# Patient Record
Sex: Male | Born: 1985 | Race: Black or African American | Hispanic: No | Marital: Single | Smoking: Light tobacco smoker
Health system: Southern US, Community
[De-identification: ages and names within clinical notes are randomized; demographics above are authoritative.]

## PROBLEM LIST (undated history)

## (undated) DIAGNOSIS — Z8489 Family history of other specified conditions: Secondary | ICD-10-CM

## (undated) DIAGNOSIS — Z98811 Dental restoration status: Secondary | ICD-10-CM

## (undated) DIAGNOSIS — R221 Localized swelling, mass and lump, neck: Secondary | ICD-10-CM

## (undated) DIAGNOSIS — F32A Depression, unspecified: Secondary | ICD-10-CM

## (undated) DIAGNOSIS — F419 Anxiety disorder, unspecified: Secondary | ICD-10-CM

## (undated) HISTORY — DX: Depression, unspecified: F32.A

## (undated) HISTORY — DX: Anxiety disorder, unspecified: F41.9

## (undated) HISTORY — PX: NO PAST SURGERIES: SHX2092

---
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
## 2010-06-04 ENCOUNTER — Ambulatory Visit
Admission: RE | Admit: 2010-06-04 | Discharge: 2010-06-04 | Disposition: A | Discharge status: Home / Self Care | Payer: BC Managed Care – PPO | Source: Ambulatory Visit | Attending: Family Medicine | Admitting: Family Medicine

## 2010-06-04 ENCOUNTER — Other Ambulatory Visit: Payer: Self-pay | Admitting: Family Medicine

## 2010-06-04 DIAGNOSIS — R0600 Dyspnea, unspecified: Secondary | ICD-10-CM

## 2010-06-04 MED ORDER — IOHEXOL 300 MG/ML  SOLN
125.0000 mL | Freq: Once | INTRAMUSCULAR | Status: AC | PRN
  Administered 2010-06-04: 125 mL via INTRAVENOUS

## 2011-10-04 ENCOUNTER — Ambulatory Visit (INDEPENDENT_AMBULATORY_CARE_PROVIDER_SITE_OTHER): Payer: BC Managed Care – PPO | Admitting: Internal Medicine

## 2011-10-04 ENCOUNTER — Ambulatory Visit: Payer: BC Managed Care – PPO

## 2011-10-04 VITALS — BP 130/90 | HR 68 | Temp 98.1°F | Resp 16 | Ht 76.0 in | Wt 215.0 lb

## 2011-10-04 DIAGNOSIS — R51 Headache: Secondary | ICD-10-CM

## 2011-10-04 DIAGNOSIS — R55 Syncope and collapse: Secondary | ICD-10-CM

## 2011-10-04 LAB — COMPREHENSIVE METABOLIC PANEL
ALT: 14 U/L (ref 0–53)
AST: 18 U/L (ref 0–37)
Albumin: 4.6 g/dL (ref 3.5–5.2)
Alkaline Phosphatase: 54 U/L (ref 39–117)
BUN: 12 mg/dL (ref 6–23)
CO2: 30 mEq/L (ref 19–32)
Calcium: 9.8 mg/dL (ref 8.4–10.5)
Chloride: 99 mEq/L (ref 96–112)
Creat: 1.16 mg/dL (ref 0.50–1.35)
Glucose, Bld: 96 mg/dL (ref 70–99)
Potassium: 4.6 mEq/L (ref 3.5–5.3)
Sodium: 138 mEq/L (ref 135–145)
Total Bilirubin: 0.6 mg/dL (ref 0.3–1.2)
Total Protein: 7.5 g/dL (ref 6.0–8.3)

## 2011-10-04 LAB — POCT CBC
Granulocyte percent: 39.3 %G (ref 37–80)
HCT, POC: 43.8 % (ref 43.5–53.7)
Hemoglobin: 13.8 g/dL — AB (ref 14.1–18.1)
Lymph, poc: 2.5 (ref 0.6–3.4)
MCH, POC: 28.6 pg (ref 27–31.2)
MCHC: 31.5 g/dL — AB (ref 31.8–35.4)
MCV: 90.6 fL (ref 80–97)
MID (cbc): 0.5 (ref 0–0.9)
MPV: 10 fL (ref 0–99.8)
POC Granulocyte: 2 (ref 2–6.9)
POC LYMPH PERCENT: 50.1 %L — AB (ref 10–50)
POC MID %: 10.6 %M (ref 0–12)
Platelet Count, POC: 169 10*3/uL (ref 142–424)
RBC: 4.83 M/uL (ref 4.69–6.13)
RDW, POC: 13.1 %
WBC: 5 10*3/uL (ref 4.6–10.2)

## 2011-10-04 LAB — TSH: TSH: 1.564 u[IU]/mL (ref 0.350–4.500)

## 2011-10-04 LAB — GLUCOSE, POCT (MANUAL RESULT ENTRY): POC Glucose: 102 mg/dl — AB (ref 70–99)

## 2011-10-04 NOTE — Patient Instructions (Addendum)
Syncope Syncope is a fainting spell. This means the person loses consciousness and drops to the ground. The person is generally unconscious for less than 5 minutes. The person may have some muscle twitches for up to 15 seconds before waking up and returning to normal. Syncope occurs more often in elderly people, but it can happen to anyone. While most causes of syncope are not dangerous, syncope can be a sign of a serious medical problem. It is important to seek medical care.  CAUSES  Syncope is caused by a sudden decrease in blood flow to the brain. The specific cause is often not determined. Factors that can trigger syncope include:  Taking medicines that lower blood pressure.  Sudden changes in posture, such as standing up suddenly.  Taking more medicine than prescribed.  Standing in one place for too long.  Seizure disorders.  Dehydration and excessive exposure to heat.  Low blood sugar (hypoglycemia).  Straining to have a bowel movement.  Heart disease, irregular heartbeat, or other circulatory problems.  Fear, emotional distress, seeing blood, or severe pain. SYMPTOMS  Right before fainting, you may:  Feel dizzy or lightheaded.  Feel nauseous.  See all white or all black in your field of vision.  Have cold, clammy skin. DIAGNOSIS  Your caregiver will ask about your symptoms, perform a physical exam, and perform electrocardiography (ECG) to record the electrical activity of your heart. Your caregiver may also perform other heart or blood tests to determine the cause of your syncope. TREATMENT  In most cases, no treatment is needed. Depending on the cause of your syncope, your caregiver may recommend changing or stopping some of your medicines. HOME CARE INSTRUCTIONS  Have someone stay with you until you feel stable.  Do not drive, operate machinery, or play sports until your caregiver says it is okay.  Keep all follow-up appointments as directed by your  caregiver.  Lie down right away if you start feeling like you might faint. Breathe deeply and steadily. Wait until all the symptoms have passed.  Drink enough fluids to keep your urine clear or pale yellow.  If you are taking blood pressure or heart medicine, get up slowly, taking several minutes to sit and then stand. This can reduce dizziness. SEEK IMMEDIATE MEDICAL CARE IF:   You have a severe headache.  You have unusual pain in the chest, abdomen, or back.  You are bleeding from the mouth or rectum, or you have black or tarry stool.  You have an irregular or very fast heartbeat.  You have pain with breathing.  You have repeated fainting or seizure-like jerking during an episode.  You faint when sitting or lying down.  You have confusion.  You have difficulty walking.  You have severe weakness.  You have vision problems. If you fainted, call your local emergency services (911 in [place] Do not drive yourself to the hospital.  MAKE SURE YOU:  Understand these instructions.  Will watch your condition.  Will get help right away if you are not doing well or get worse. Document Released: 12/20/2004 Document Revised: 06/21/2011 Document Reviewed: 02/18/2011 Exit[employer] Patient Information 2013 [employer].  

## 2011-10-04 NOTE — Progress Notes (Signed)
  Subjective:    Patient ID: Robert Schroeder, male    DOB: 03-Jan-1986, 26 y.o.   MRN: [id number]  HPI At phone store, felt dizzy and passed out and fell to floor. EMTs came and evaluated him and cleared him to go home. No hx for seizure activity, no cp or palpitations, no fhx of sudden death or heart disease. Never had these sxs before. Plays basketball regularly with no sxs or problems Review of Systems neg    Objective:   Physical Exam  Constitutional: He is oriented to person, place, and time. He appears well-developed and well-nourished.  HENT:  Right Ear: External ear normal.  Left Ear: External ear normal.  Nose: Nose normal.  Eyes: EOM are normal. Pupils are equal, round, and reactive to light.  Neck: Normal range of motion. Neck supple.  Cardiovascular: Normal rate, regular rhythm and normal heart sounds.   Pulmonary/Chest: Effort normal and breath sounds normal. He exhibits no tenderness.  Abdominal: Soft. Bowel sounds are normal.  Neurological: He is alert and oriented to person, place, and time. He has normal reflexes. No cranial nerve deficit. He exhibits normal muscle tone. Coordination normal.  Skin: Skin is warm and dry.  Psychiatric: He has a normal mood and affect. His behavior is normal. Judgment and thought content normal.  Balance and rhomberg excellent [facility] reading (PRIMARY) by  Dr.Lanyla Costello nl cxr Results for orders placed in visit on 10/04/11  GLUCOSE, POCT (MANUAL RESULT ENTRY)      Component Value Range   POC Glucose 102 (*) 70 - 99 mg/dl  POCT CBC      Component Value Range   WBC 5.0  4.6 - 10.2 K/uL   Lymph, poc 2.5  0.6 - 3.4   POC LYMPH PERCENT 50.1 (*) 10 - 50 %L   MID (cbc) 0.5  0 - 0.9   POC MID % 10.6  0 - 12 %M   POC Granulocyte 2.0  2 - 6.9   Granulocyte percent 39.3  37 - 80 %G   RBC 4.83  4.69 - 6.13 M/uL   Hemoglobin 13.8 (*) 14.1 - 18.1 g/dL   HCT, POC 52.8  41.3 - 53.7 %   MCV 90.6  80 - 97 fL   MCH, POC 28.6  27 - 31.2 pg   MCHC 31.5 (*)  31.8 - 35.4 g/dL   RDW, POC 24.4     Platelet Count, POC 169  142 - 424 K/uL   MPV 10.0  0 - 99.8 fL   .ekg appears nl         Assessment & Plan:  Syncope RF to cardiology If negative w/up consider neuro rf RTC after cardiology eval. Keep diary

## 2011-10-05 ENCOUNTER — Encounter: Payer: Self-pay | Admitting: Cardiology

## 2011-10-05 ENCOUNTER — Encounter: Payer: Self-pay | Admitting: *Deleted

## 2011-10-06 ENCOUNTER — Encounter: Payer: Self-pay | Admitting: Family Medicine

## 2011-10-06 ENCOUNTER — Ambulatory Visit (INDEPENDENT_AMBULATORY_CARE_PROVIDER_SITE_OTHER): Payer: BC Managed Care – PPO | Admitting: Cardiovascular Disease

## 2011-10-06 ENCOUNTER — Encounter: Payer: Self-pay | Admitting: Cardiovascular Disease

## 2011-10-06 ENCOUNTER — Ambulatory Visit (HOSPITAL_COMMUNITY): Payer: BC Managed Care – PPO | Attending: Cardiology

## 2011-10-06 VITALS — BP 133/87 | HR 53 | Ht 75.0 in | Wt 215.0 lb

## 2011-10-06 DIAGNOSIS — I369 Nonrheumatic tricuspid valve disorder, unspecified: Secondary | ICD-10-CM | POA: Insufficient documentation

## 2011-10-06 DIAGNOSIS — R55 Syncope and collapse: Secondary | ICD-10-CM | POA: Insufficient documentation

## 2011-10-06 DIAGNOSIS — I359 Nonrheumatic aortic valve disorder, unspecified: Secondary | ICD-10-CM | POA: Insufficient documentation

## 2011-10-06 NOTE — Progress Notes (Signed)
Patient ID: Robert Schroeder, male   DOB: 1985-11-22, 26 y.o.   MRN: [id number] 26 yo with syncope last week  Had played basketball an hour or two before Standing at [employer] counter and got dizzyy.  No chest pain or palpitaitons.  EMS no no arrhythmia or BP abnormality.  Felt better in 15 minutes.  No seizure activity Last episode years ago in highschool  Normally drinks lots of water and not dehydrated  No drugs.  Active and normally no issues with exercise.  No family history of sudden death.  On no meds.  Denies ETOH.    ROS: Denies fever, malais, weight loss, blurry vision, decreased visual acuity, cough, sputum, SOB, hemoptysis, pleuritic pain, palpitaitons, heartburn, abdominal pain, melena, lower extremity edema, claudication, or rash.  All other systems reviewed and negative   General: Affect appropriate Healthy:  appears stated age HEENT: normal Neck supple with no adenopathy JVP normal no bruits no thyromegaly Lungs clear with no wheezing and good diaphragmatic motion Heart:  S1/S2 no murmur,rub, gallop or click PMI normal Abdomen: benighn, BS positve, no tenderness, no AAA no bruit.  No HSM or HJR Distal pulses intact with no bruits No edema Neuro non-focal Skin warm and dry No muscular weakness  Medications No current outpatient prescriptions on file.    Allergies Review of patient's allergies indicates no known allergies.  Family History: No family history on file.  Social History: History   Social History  . Marital Status: Single    Spouse Name: N/A    Number of Children: N/A  . Years of Education: N/A   Occupational History  . Not on file.   Social History Main Topics  . Smoking status: Never Smoker   . Smokeless tobacco: Not on file  . Alcohol Use: Not on file  . Drug Use: Not on file  . Sexually Active: Not on file   Other Topics Concern  . Not on file   Social History Narrative  . No narrative on file    Electrocardiogram:  NSR normal QT no LVH  no AV block  10/04/11  Assessment and Plan

## 2011-10-06 NOTE — Assessment & Plan Note (Signed)
Not clear of etiology no high risk family history and normal ECG and exam.  Start with ETT and echo  See coronary artery origins if possible on echo.  F/U with me after tests

## 2011-10-06 NOTE — Patient Instructions (Signed)
Your physician recommends that you schedule a follow-up appointment in: AFTER TEST DONE Your physician has requested that you have an echocardiogram. Echocardiography is a painless test that uses sound waves to create images of your heart. It provides your doctor with information about the size and shape of your heart and how well your heart's chambers and valves are working. This procedure takes approximately one hour. There are no restrictions for this procedure. DX SYNCOPE  Your physician has requested that you have an exercise tolerance test. For further information please visit [email or web address] Please also follow instruction sheet, as given. DX SYNCOPE

## 2011-10-06 NOTE — Progress Notes (Signed)
Echocardiogram performed.  

## 2011-10-18 ENCOUNTER — Encounter: Payer: BC Managed Care – PPO | Admitting: Cardiovascular Disease

## 2011-10-25 ENCOUNTER — Encounter: Payer: Self-pay | Admitting: Physician Assistant

## 2011-10-25 ENCOUNTER — Ambulatory Visit (INDEPENDENT_AMBULATORY_CARE_PROVIDER_SITE_OTHER): Payer: BC Managed Care – PPO | Admitting: Physician Assistant

## 2011-10-25 DIAGNOSIS — R55 Syncope and collapse: Secondary | ICD-10-CM

## 2011-10-25 NOTE — Procedures (Signed)
Exercise Treadmill Test  Pre-Exercise Testing Evaluation Rhythm: normal sinus  Rate: 66   PR:  .15 QRS:  .11  QT:  .42 QTc: .44     Test  Exercise Tolerance Test Ordering MD: Charlton Haws, MD  Interpreting MD: Tereso Newcomer , PA-C  Unique Test No: 1  Treadmill:  1  Indication for ETT: Syncope  Contraindication to ETT: No   Stress Modality: exercise - treadmill  Cardiac Imaging Performed: non   Protocol: standard Bruce - maximal  Max BP:  173/69  Max MPHR (bpm):  195 85% MPR (bpm):  166  MPHR obtained (bpm):  162 % MPHR obtained:  83%  Reached 85% MPHR (min:sec):  n/a Total Exercise Time (min-sec):  15:00  Workload in METS:  17.2 Borg Scale: 15  Reason ETT Terminated:  patient's desire to stop    ST Segment Analysis At Rest: normal ST segments - no evidence of significant ST depression With Exercise: no evidence of significant ST depression  Other Information Arrhythmia:  No Angina during ETT:  absent (0) Quality of ETT:  diagnostic  ETT Interpretation:  normal - no evidence of ischemia by ST analysis  Comments: Excellent exercise tolerance. He did report chest pain after the test was complete. Normal BP response to exercise. No ST-T changes to suggest ischemia at 83% age [facility].   Recommendations: Patient did not achieve 85% age [facility]. However, with excellent exercise capacity, this is, overall, a low risk study. Follow up with Dr. Charlton Haws as directed. Luna Glasgow, PA-C  3:39 PM 10/25/2011

## 2011-11-08 ENCOUNTER — Ambulatory Visit: Payer: BC Managed Care – PPO | Admitting: Cardiovascular Disease

## 2011-11-14 DIAGNOSIS — R51 Headache: Secondary | ICD-10-CM | POA: Insufficient documentation

## 2011-11-14 DIAGNOSIS — R519 Headache, unspecified: Secondary | ICD-10-CM | POA: Insufficient documentation

## 2011-11-14 DIAGNOSIS — R079 Chest pain, unspecified: Secondary | ICD-10-CM | POA: Insufficient documentation

## 2011-11-16 ENCOUNTER — Ambulatory Visit (INDEPENDENT_AMBULATORY_CARE_PROVIDER_SITE_OTHER): Payer: BC Managed Care – PPO | Admitting: Cardiovascular Disease

## 2011-11-16 ENCOUNTER — Encounter: Payer: Self-pay | Admitting: Cardiovascular Disease

## 2011-11-16 VITALS — BP 138/66 | HR 64 | Resp 18 | Ht 75.0 in | Wt 218.0 lb

## 2011-11-16 DIAGNOSIS — R079 Chest pain, unspecified: Secondary | ICD-10-CM

## 2011-11-16 DIAGNOSIS — R55 Syncope and collapse: Secondary | ICD-10-CM

## 2011-11-16 DIAGNOSIS — R51 Headache: Secondary | ICD-10-CM

## 2011-11-16 NOTE — Assessment & Plan Note (Signed)
Non recurrent no evidence of cardiac etiology  Normal ECG, exam, ETT and echo

## 2011-11-16 NOTE — Assessment & Plan Note (Signed)
Non recurrent normal ETT observe

## 2011-11-16 NOTE — Patient Instructions (Signed)
Your physician recommends that you schedule a follow-up appointment in: AS NEEDED  Your physician recommends that you continue on your current medications as directed. Please refer to the Current Medication list given to you today.  

## 2011-11-16 NOTE — Assessment & Plan Note (Signed)
Further w/u per Dr Perrin Maltese Does not appear migraneous  PRN NSAi's and or ASA

## 2011-11-16 NOTE — Progress Notes (Signed)
Patient ID: Robert Schroeder, male   DOB: Oct 30, 1985, 26 y.o.   MRN: [id number] 26 yo initially seen for syncope 10/04/11  Reviewed f/u testing  ETT normal with no ischemia and normal HR and BP response Echo :  Normal including coronary artery origins  Since last visity still with some headaches No recurrent syncope  No chest pain palpitations or syncope  ROS: Denies fever, malais, weight loss, blurry vision, decreased visual acuity, cough, sputum, SOB, hemoptysis, pleuritic pain, palpitaitons, heartburn, abdominal pain, melena, lower extremity edema, claudication, or rash.  All other systems reviewed and negative  General: Affect appropriate Healthy:  appears stated age HEENT: normal Neck supple with no adenopathy JVP normal no bruits no thyromegaly Lungs clear with no wheezing and good diaphragmatic motion Heart:  S1/S2 no murmur, no rub, gallop or click PMI normal Abdomen: benighn, BS positve, no tenderness, no AAA no bruit.  No HSM or HJR Distal pulses intact with no bruits No edema Neuro non-focal Skin warm and dry No muscular weakness   No current outpatient prescriptions on file.    Allergies  Review of patient's allergies indicates no known allergies.  Electrocardiogram:  10/04/11  SR rate 56 normal QT 425  Assessment and Plan

## 2011-11-21 ENCOUNTER — Telehealth: Payer: Self-pay | Admitting: Cardiovascular Disease

## 2011-11-21 NOTE — Telephone Encounter (Signed)
Plz return call to pt [phone] regarding work note.

## 2011-11-29 NOTE — Telephone Encounter (Signed)
PER PT  EVERYTHING IS TAKEN CARE OF .Robert Schroeder

## 2011-12-21 ENCOUNTER — Emergency Department (HOSPITAL_COMMUNITY)
Admission: EM | Admit: 2011-12-21 | Discharge: 2011-12-21 | Disposition: A | Discharge status: Home / Self Care | Payer: BC Managed Care – PPO | Attending: Emergency Medicine | Admitting: Emergency Medicine

## 2011-12-21 ENCOUNTER — Emergency Department (HOSPITAL_COMMUNITY): Payer: BC Managed Care – PPO

## 2011-12-21 ENCOUNTER — Encounter (HOSPITAL_COMMUNITY): Payer: Self-pay | Admitting: Emergency Medicine

## 2011-12-21 DIAGNOSIS — R259 Unspecified abnormal involuntary movements: Secondary | ICD-10-CM | POA: Insufficient documentation

## 2011-12-21 DIAGNOSIS — R4782 Fluency disorder in conditions classified elsewhere: Secondary | ICD-10-CM | POA: Insufficient documentation

## 2011-12-21 DIAGNOSIS — R55 Syncope and collapse: Secondary | ICD-10-CM

## 2011-12-21 DIAGNOSIS — Z8679 Personal history of other diseases of the circulatory system: Secondary | ICD-10-CM | POA: Insufficient documentation

## 2011-12-21 DIAGNOSIS — F8081 Childhood onset fluency disorder: Secondary | ICD-10-CM

## 2011-12-21 LAB — CBC WITH DIFFERENTIAL/PLATELET
Basophils Absolute: 0 10*3/uL (ref 0.0–0.1)
Basophils Relative: 0 % (ref 0–1)
Eosinophils Absolute: 0.1 10*3/uL (ref 0.0–0.7)
Eosinophils Relative: 1 % (ref 0–5)
HCT: 43.4 % (ref 39.0–52.0)
Hemoglobin: 14.7 g/dL (ref 13.0–17.0)
Lymphocytes Relative: 43 % (ref 12–46)
Lymphs Abs: 2.7 10*3/uL (ref 0.7–4.0)
MCH: 29.1 pg (ref 26.0–34.0)
MCHC: 33.9 g/dL (ref 30.0–36.0)
MCV: 85.8 fL (ref 78.0–100.0)
Monocytes Absolute: 0.5 10*3/uL (ref 0.1–1.0)
Monocytes Relative: 8 % (ref 3–12)
Neutro Abs: 3 10*3/uL (ref 1.7–7.7)
Neutrophils Relative %: 48 % (ref 43–77)
Platelets: 185 10*3/uL (ref 150–400)
RBC: 5.06 MIL/uL (ref 4.22–5.81)
RDW: 12.2 % (ref 11.5–15.5)
WBC: 6.2 10*3/uL (ref 4.0–10.5)

## 2011-12-21 LAB — BASIC METABOLIC PANEL
BUN: 8 mg/dL (ref 6–23)
CO2: 28 mEq/L (ref 19–32)
Calcium: 9.7 mg/dL (ref 8.4–10.5)
Chloride: 102 mEq/L (ref 96–112)
Creatinine, Ser: 1.08 mg/dL (ref 0.50–1.35)
GFR calc Af Amer: >90 mL/min (ref >90)
GFR calc non Af Amer: >90 mL/min (ref >90)
Glucose, Bld: 93 mg/dL (ref 70–99)
Potassium: 4 mEq/L (ref 3.5–5.1)
Sodium: 139 mEq/L (ref 135–145)

## 2011-12-21 NOTE — ED Notes (Signed)
Patient transported to MRI 

## 2011-12-21 NOTE — Progress Notes (Signed)
Pt confirms pcp is dr Clarene Duke

## 2011-12-21 NOTE — ED Provider Notes (Signed)
History     CSN: [id number]  Arrival date & time 12/21/11  1515   First MD Initiated Contact with Patient 12/21/11 1615      Chief Complaint  Patient presents with  . Dizziness    (Consider location/radiation/quality/duration/timing/severity/associated sxs/prior treatment) HPI Comments: Patient with history of several syncopal and near-syncopal episodes over the past several months, has had negative cardiology workup presents today with episode of lightheadedness without syncope. Today's episode is different because the patient now presents with stuttering speech. As per previous episodes, the patient had a tremor for some time after. No treatments prior to arrival. Patient denies headaches, blurry vision, weakness in his arms or his legs. He denies trouble walking. No nausea or vomiting. No fever or neck pain. Patient denies head injuries. Symptoms were acute. Course constant.  The history is provided by the patient.    Past Medical History  Diagnosis Date  . Syncope   . Chest pain   . Headache     History reviewed. No pertinent past surgical history.  No family history on file.  History  Substance Use Topics  . Smoking status: Never Smoker   . Smokeless tobacco: Not on file  . Alcohol Use: No      Review of Systems  Constitutional: Negative for fever.  HENT: Negative for sore throat and rhinorrhea.   Eyes: Negative for redness.  Respiratory: Negative for cough.   Cardiovascular: Negative for chest pain.  Gastrointestinal: Negative for nausea, vomiting, abdominal pain and diarrhea.  Genitourinary: Negative for dysuria.  Musculoskeletal: Negative for myalgias.  Skin: Negative for rash.  Neurological: Positive for tremors, syncope, speech difficulty and light-headedness. Negative for seizures, facial asymmetry, weakness, numbness and headaches.  Psychiatric/Behavioral: Negative for confusion.    Allergies  Mushroom extract complex  Home Medications   Current  Outpatient Rx  Name  Route  Sig  Dispense  Refill  . PHENOL 1.4 % MT LIQD   Mouth/Throat   Use as directed 1 spray in the mouth or throat as needed. For sore throat           BP 142/109  Pulse 67  Temp 98.7 F (37.1 C) (Oral)  Resp 20  SpO2 99%  Physical Exam  Nursing note and vitals reviewed. Constitutional: He is oriented to person, place, and time. He appears well-developed and well-nourished.  HENT:  Head: Normocephalic and atraumatic.  Right Ear: Tympanic membrane, external ear and ear canal normal.  Left Ear: Tympanic membrane, external ear and ear canal normal.  Nose: Nose normal.  Mouth/Throat: Uvula is midline, oropharynx is clear and moist and mucous membranes are normal.  Eyes: Conjunctivae normal, EOM and lids are normal. Pupils are equal, round, and reactive to light.  Neck: Normal range of motion. Neck supple.  Cardiovascular: Normal rate and regular rhythm.   Pulmonary/Chest: Effort normal and breath sounds normal.  Abdominal: Soft. There is no tenderness.  Musculoskeletal: Normal range of motion.       Cervical back: He exhibits normal range of motion, no tenderness and no bony tenderness.  Neurological: He is alert and oriented to person, place, and time. He has normal strength and normal reflexes. No cranial nerve deficit or sensory deficit. He exhibits normal muscle tone. He displays a negative Romberg sign. Coordination and gait normal. GCS eye subscore is 4. GCS verbal subscore is 5. GCS motor subscore is 6.       Stuttering speech  Skin: Skin is warm and dry.  Psychiatric: He has  a normal mood and affect.    ED Course  Procedures (including critical care time)   Labs Reviewed  CBC WITH DIFFERENTIAL  BASIC METABOLIC PANEL  LAB REPORT - SCANNED   Mr Brain Wo Contrast  12/21/2011  *RADIOLOGY REPORT*  Clinical Data: 26 year old male with dizziness, left side weakness. Recent syncope and headache.  MRI HEAD WITHOUT CONTRAST  Technique:  Multiplanar,  multiecho pulse sequences of the brain and surrounding structures were obtained according to standard protocol without intravenous contrast.  Comparison: None.  Findings: Normal cerebral volume. No restricted diffusion to suggest acute infarction.  No midline shift, mass effect, evidence of mass lesion, ventriculomegaly, extra-axial collection or acute intracranial hemorrhage.  Cervicomedullary junction and pituitary are within normal limits.  Major intracranial vascular flow voids are preserved. Wallace Cullens and white matter signal is within normal limits throughout the brain.  Grossly normal visualized internal auditory structures.  Negative visualized cervical spine.  Normal bone marrow signal. Visualized orbit soft tissues are within normal limits.  Visualized paranasal sinuses and mastoids are clear.  Negative scalp soft tissues.  IMPRESSION: Normal noncontrast MRI appearance of the brain.   Original Report Authenticated By: Erskine Speed, M.D.      1. Near syncope   2. Stuttering     4:17 PM Reviewed cardiologist notes.   Vital signs reviewed and are as follows: Filed Vitals:   12/21/11 1530  BP: 142/109  Pulse: 67  Temp: 98.7 F (37.1 C)  Resp: 20   Pt seen and examined. D/w Dr. Rubin Payor.   5:56 PM Awaiting MRI.   Labs were normal. MRI is normal. Patient and family informed.   On re-exam, patient is entirely back to normal. Speech is clear without stuttering.   Patient is given neurology follow-up. Family and patient states they will follow-up.   Patient counseled to return if they have weakness in their arms or legs, slurred speech, trouble walking or talking, confusion, trouble with their balance, or if they have any other concerns. Patient verbalizes understanding and agrees with plan.   MDM  Patient with near syncope. Recently has had cardiac work-up. MRI ordered and is normal. Speech problem has resolved in ED. Do not feel this represents TIA. Stroke, tumor, bleeding not seen on  MRI. Patient at baseline at discharge.         [place], [place] 12/22/11 [phone]

## 2011-12-21 NOTE — ED Notes (Signed)
Pt presenting to ed with c/o dizziness, cold symptoms. Pt states slurred speech this morning unable able to tell if pt is slurred. Per pt's mother pt is not talking normal. Per pt he was seen recently at [facility] for the same. Pt states he has seen a cardiologist and was told this happens some time. Pt with bilateral equal grips noted. No slurred speech noted in triage.

## 2011-12-22 NOTE — ED Provider Notes (Signed)
Medical screening examination/treatment/procedure(s) were performed by non-physician practitioner and as supervising physician I was immediately available for consultation/collaboration.    Nelia Shi, MD 12/22/11 2251

## 2012-01-05 ENCOUNTER — Ambulatory Visit (INDEPENDENT_AMBULATORY_CARE_PROVIDER_SITE_OTHER): Payer: BC Managed Care – PPO | Admitting: Physician Assistant

## 2012-01-05 VITALS — BP 128/81 | HR 64 | Temp 98.8°F | Resp 16 | Ht 75.0 in | Wt 210.0 lb

## 2012-01-05 DIAGNOSIS — R51 Headache: Secondary | ICD-10-CM

## 2012-01-05 DIAGNOSIS — R55 Syncope and collapse: Secondary | ICD-10-CM

## 2012-01-05 MED ORDER — BUTALBITAL-APAP-CAFFEINE 50-500-40 MG PO TABS: 1.0000 | ORAL_TABLET | ORAL | Status: DC | PRN

## 2012-01-05 NOTE — Progress Notes (Signed)
Subjective:    Patient ID: Robert Schroeder, male    DOB: [birth year], 27 y.o.   MRN: [id number]  HPI   Robert Schroeder is a very pleasant 27 yr old male who is accompanied by his mother today.  He was initially seen here 10/04/11 after a syncopal episode.  He had a negative workup by cardiology over the next several weeks.  He presented to the [facility] ED 12/11/11 for an episode of pre-syncope with associated stuttering of speech.  Workup in the ED was negative, including normal MRI of the brain.  He has an appointment to see neurology next week on 01/12/12.  For the last week or so Robert Schroeder states he has been having "migraines".  After the headaches go away he gets "sharp pains" all over.  The headaches occasionally wake him up in the night, or he wakes up with headaches in the morning.  Occasionally HA is accompanied by blurred vision.  He often has photophobia and phonophobia.  States he has recently become very sensitive to smells.  The pain is generally right sided, but sometimes frontal as well.  Sometimes he has pain at the top of his head.  Some times pain in the right side of his neck.  Headaches last minutes to hours.  Generally he drinks some water and goes to a dark room to rest.  Sometimes takes ibuprofen or BC powder.  No family history of migraine.  No prior history of HA.   Additionally he continues to have pre-syncopal episodes, especially when standing up.  Sometimes these occur with his HAs, sometimes independently.  No further syncope.  Sometimes he feels his heart race with these episodes, sometimes he is sweaty and nauseous.  Has some concern of BP as it was elevated at last hospital visit.  Does have family hx of HTN but BP has returned to normal, today 128/81  States he is sleeping a lot lately due to headaches.  Stays well hydrated, drinks water throughout the day.  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  Denies any stressors at work or home.      Review of Systems  Constitutional: Negative for fever and chills.  HENT: Positive for neck pain (right sided). Negative for ear pain, congestion, sore throat, rhinorrhea and sinus pressure.   Eyes: Negative.   Respiratory: Positive for shortness of breath (occasionally). Negative for cough.   Cardiovascular: Positive for palpitations (occasionally). Negative for chest pain.  Gastrointestinal: Negative.   Skin: Negative.   Neurological: Positive for dizziness, light-headedness and headaches. Negative for syncope.       Objective:   Physical Exam  Vitals reviewed. Constitutional: He is oriented to person, place, and time. He appears well-developed and well-nourished. No distress.  HENT:  Head: Normocephalic and atraumatic.  Eyes: Conjunctivae normal and EOM are normal. Pupils are equal, round, and reactive to light. No scleral icterus.  Fundoscopic exam:      The right eye shows no arteriolar narrowing, no AV nicking and no papilledema.       The left eye shows no arteriolar narrowing, no AV nicking and no papilledema.  Neck: Normal range of motion. Neck supple.  Cardiovascular: Normal rate, regular rhythm, normal heart sounds and intact distal pulses.  Exam reveals no gallop and no friction rub.   No murmur heard. Pulmonary/Chest: Effort normal and breath sounds normal. He has no wheezes. He has no rales.  Neurological: He is alert  and oriented to person, place, and time. He has normal reflexes. No cranial nerve deficit. Coordination normal.  Skin: Skin is warm and dry.  Psychiatric: He has a normal mood and affect. His behavior is normal.      Filed Vitals:   01/05/12 1344  BP: 128/81  Pulse: 64  Temp: 98.8 F (37.1 C)  Resp: 16       Assessment & Plan:   1. Headache  butalbital-acetaminophen-caffeine (ESGIC PLUS) 50-500-40 MG per tablet  2. Pre-syncope       Robert Schroeder is a very pleasant 27 yr old male here with several concerns and to follow-up after an ED  visit.  (1) Headache - Etiology is somewhat unclear.  This could be migraine although the presentation is not classic given differing locations of his pain.  Exam is normal today.  No neuro deficits.  Will try Fioricet for headaches.  Neurology appointment is scheduled for next week, will await their input.    (2) Pre-syncope - Again, etiology is unclear.  Cardiac work up is negative, as is brain MRI from two weeks ago.  Exam is normal today.  Wonder if there may be an anxiety component, though patient denies this.  Again, will await neurology input.    (3) Elevated blood pressure - Discussed with pt and mom that BP is normal today.  Pt states he can check his pressure at work.  Encouraged to check BP approx twice/week for the next couple weeks.  If he consistently sees elevated readings, we can discuss medication.  Discussed RTC precautions with pt and mother.  If he is worsening between now and his neuro appt he will RTC or proceed to the ED.

## 2012-01-05 NOTE — Patient Instructions (Addendum)
Begin using Fioricet when you feel a headache coming on.  Start with 1 pill, but you may take 2 at a time if needed.  You may take this as frequently as every 4 hours if needed.  DO NOT exceed more than 6 doses per day.  DO NOT take additional Tylenol or products containing acetaminophen.  You may take ibuprofen.    Let's see how you neurology appointment goes on 01/12/12 and go from there.  Let us know if anything comes up in the mean time.  If you feel like your episodes are worsening between now and your neurology appointment, come back in or proceed to the ER.

## 2012-01-20 ENCOUNTER — Ambulatory Visit: Payer: BC Managed Care – PPO | Admitting: Emergency Medicine

## 2012-01-20 ENCOUNTER — Ambulatory Visit: Payer: BC Managed Care – PPO

## 2012-01-20 VITALS — BP 126/83 | HR 59 | Temp 98.3°F | Resp 16 | Ht 76.0 in | Wt 211.8 lb

## 2012-01-20 DIAGNOSIS — R079 Chest pain, unspecified: Secondary | ICD-10-CM

## 2012-01-20 DIAGNOSIS — M79642 Pain in left hand: Secondary | ICD-10-CM

## 2012-01-20 DIAGNOSIS — M79609 Pain in unspecified limb: Secondary | ICD-10-CM

## 2012-01-20 NOTE — Patient Instructions (Signed)
Contusion (Bruise) of Hand  An injury to the hand may cause bruises (contusions). Contusions are caused by bleeding from small blood vessels (capillaries) that allow blood to leak out into the muscles, tendons, and surrounding soft tissue. This is followed by swelling and pain (inflammation). Contusions of the hand are common because of the use of hands in daily and recreational activities. Signs of a hand injury include pain, swelling, and a color change. Initially the skin may turn blue to purple in color. As the bruise ages, the color turns yellow and orange. Swelling may decrease the movement of the fingers. Contusions are seen more commonly with:   Contact sports (especially in football, wrestling, and basketball).   Use of medications that thin the blood (anticoagulants).   Use of aspirin and nonsteroidal anti-inflammatory agents that decrease the ability of the blood to clot.   Vitamin deficiencies.   Aging.  DIAGNOSIS   Diagnosis of hand injuries can be made by your own observation. If problems continue, a caregiver may be required for further evaluation and treatment. X-rays may be required to make sure there are no broken bones (fractures). Continued problems may require physical therapy for treatment.  RISKS AND COMPLICATIONS   Extensive bleeding and tissue inflammation. This can lead to disability and arthritis-type problems later on if the hand does not heal properly.   Infection of the hand if there are breaks in the skin. This is especially true if the hand injury came from someone's teeth, such as would occur with punching someone in the mouth. This can lead to an infection of the tendons and the membranes surrounding the tendons (sheaths). This infection can have severe complications including a loss of function (a "frozen" hand).   Rupture of the tendons requiring a surgical repair. Failure to repair the tendons can result in loss of function of the hand or fingers.  HOME CARE INSTRUCTIONS     Apply ice to the injury for 15 to 20 minutes, 3 to 4 times per day. Put the ice in a plastic bag and place a towel between the bag of ice and your skin.   An elastic bandage may be used initially for support and to minimize swelling. Do not wrap the hand too tightly. Do not sleep with the elastic bandage on.   Gentle massage from the fingertips towards the elbow will help keep the swelling down. Gently open and close your fist while doing this to maintain range of motion. Do this only after the first few days, when there is no or minimal pain.   Keep your hand above the level of the heart when swelling and pain are present. This will allow the fluid to drain out of the hand, decreasing the amount of swelling. This will improve healing time.   Try to avoid use of the injured hand (except for gentle range of motion) while the hand is hurting. Do not resume use until instructed by your caregiver. Then begin use gradually, do not increase use to the point of pain. If pain does develop, decrease use and continue the above measures, gradually increasing activities that do not cause discomfort until you achieve normal use.   Only take over-the-counter or prescription medicines for pain, discomfort, or fever as directed by your caregiver.   Follow up with your caregiver as directed. Follow-up care may include orthopedic referrals, physical therapy, and rehabilitation. Any delay in obtaining necessary care could result in delayed healing, or temporary or permanent disability.  REHABILITATION     Begin daily rehabilitation exercises when an elastic bandage is no longer needed and you are either pain free or only have minimal pain.   Use ice massage for 10 minutes before and after workouts. Put ice in a plastic bag and place a towel between the bag of ice and your skin. Massage the injured area with the ice pack.  SEEK IMMEDIATE MEDICAL CARE IF:    Your pain and swelling increase, or pain is uncontrolled with  medications.   You have loss of feeling in your hand, or your hand turns cold or blue.   An oral temperature above 102 F (38.9 C) develops, not controlled by medication.   Your hand becomes warm to the touch, or you have increased pain with even slight movement of your fingers.   Your hand does not begin to improve in 1 or 2 days.   The skin is broken and signs of infection occur (fluid draining from the contusion, increasing pain, fever, headache, muscle aches, dizziness, or a general ill feeling).   You develop new, unexplained problems, or an increase of the symptoms that brought you to your caregiver.  MAKE SURE YOU:    Understand these instructions.   Will watch your condition.   Will get help right away if you are not doing well or get worse.  Document Released: 06/11/2001 Document Revised: 03/14/2011 Document Reviewed: 05/29/2009  [employer] Patient Information 2013 [employer].

## 2012-01-20 NOTE — Progress Notes (Signed)
[facility] [address], [place] [address] [phone]  Date:  01/20/2012   Name:  Robert Schroeder   DOB:  [birth year]   MRN:  [id number]  PCP:  Mickie Hillier, MD    Chief Complaint: Chest Pain and Hand Pain   History of Present Illness:  Robert Schroeder is a 27 y.o. very pleasant male patient who presents with the following:  Chest pain today.  Lasted two hours and was a tightness then decreased to a discomfort. Now has a trace. No nausea or vomiting. Some dizziness and sensation of rapid heart rate.  No diaphoresis.  Has undergone workup for chest pain and syncope in past with negative stress EKG and echo.  Not seen by cardiologist since Thanksgiving.  Was at rest when this happened today.  Pain and tedderness and guarding in left hand following a basketball game where he thinks he struck another player's arm  Patient Active Problem List  Diagnosis  . Syncope  . Chest pain  . Headache    Past Medical History  Diagnosis Date  . Syncope   . Chest pain   . Headache     No past surgical history on file.  History  Substance Use Topics  . Smoking status: Never Smoker   . Smokeless tobacco: Not on file  . Alcohol Use: No    Family History  Problem Relation Age of Onset  . Thyroid disease Mother   . Hypertension Mother   . Cancer Father   . Hyperlipidemia Father   . Hypertension Maternal Grandmother   . Alcohol abuse Maternal Grandfather   . Heart disease Paternal Grandmother   . Heart disease Paternal Grandfather     Allergies  Allergen Reactions  . Mushroom Extract Complex Swelling    Throat swells    Medication list has been reviewed and updated.  Current Outpatient Prescriptions on File Prior to Visit  Medication Sig Dispense Refill  . butalbital-acetaminophen-caffeine (ESGIC PLUS) 50-500-40 MG per tablet Take 1 tablet by mouth every 4 (four) hours as needed for pain.  30 tablet  0  . phenol (CHLORASEPTIC) 1.4 % LIQD Use as  directed 1 spray in the mouth or throat as needed. For sore throat        Review of Systems:  As per HPI, otherwise negative.    Physical Examination: Filed Vitals:   01/20/12 1545  BP: 126/83  Pulse: 59  Temp: 98.3 F (36.8 C)  Resp: 16   Filed Vitals:   01/20/12 1545  Height: 6\' 4"  (1.93 m)  Weight: 211 lb 12.8 oz (96.072 kg)   Body mass index is 25.78 kg/(m^2). Ideal Body Weight: Weight in (lb) to have BMI = 25: 205   GEN: WDWN, NAD, Non-toxic, A & O x 3 HEENT: Atraumatic, Normocephalic. Neck supple. No masses, No LAD. Ears and Nose: No external deformity. CV: RRR, No M/G/R. No JVD. No thrill. No extra heart sounds. PULM: CTA B, no wheezes, crackles, rhonchi. No retractions. No resp. distress. No accessory muscle use. ABD: S, NT, ND, +BS. No rebound. No HSM. EXTR: No c/c/e NEURO Normal gait.  PSYCH: Normally interactive. Conversant. Not depressed or anxious appearing.  Calm demeanor.  LEFT hand: tender over fifth MC  Assessment and Plan: Chest pain Follow up with cardiologist  Hand contusion  Carmelina Dane, MD  [facility] reading (PRIMARY) by  Dr. Dareen Piano.  negative.

## 2012-07-16 ENCOUNTER — Ambulatory Visit: Payer: BC Managed Care – PPO

## 2013-02-03 DIAGNOSIS — R221 Localized swelling, mass and lump, neck: Secondary | ICD-10-CM

## 2013-02-03 HISTORY — DX: Localized swelling, mass and lump, neck: R22.1

## 2013-02-06 ENCOUNTER — Ambulatory Visit (INDEPENDENT_AMBULATORY_CARE_PROVIDER_SITE_OTHER): Payer: BC Managed Care – PPO | Admitting: Emergency Medicine

## 2013-02-06 VITALS — BP 127/79 | HR 60 | Temp 98.6°F | Resp 18 | Wt 232.0 lb

## 2013-02-06 DIAGNOSIS — L723 Sebaceous cyst: Secondary | ICD-10-CM

## 2013-02-06 MED ORDER — DOXYCYCLINE HYCLATE 100 MG PO CAPS: 100.0000 mg | ORAL_CAPSULE | Freq: Two times a day (BID) | ORAL | Status: DC

## 2013-02-06 NOTE — Progress Notes (Addendum)
   Subjective:  This chart was scribed for Jeannett SeniorStephen A. Samuel Jesteraube, MD by Ronal Fearuke Okeke, ED Scribe. This patient was seen in room 9 and the patient's care was started at 12:50 PM.   Patient ID: Robert Schroeder, male    DOB: 10/08/1985, 28 y.o.   MRN: [id number]  HPI HPI Comments: Robert Schroeder is a 28 y.o. male who presents to the Emergency Department complaining of a lump on the back of his neck that appeared 3 days ago with associated pain. The area has been slowly increasing in size. He has no history of a sister problems in that area. He is concerned because his father is under treatment for a leiomyosarcoma .  Patient Active Problem List   Diagnosis Date Noted  . Chest pain   . Headache   . Syncope 10/06/2011   Past Medical History  Diagnosis Date  . Syncope   . Chest pain   . Headache(784.0)    No past surgical history on file. Allergies  Allergen Reactions  . Mushroom Extract Complex Swelling    Throat swells   Prior to Admission medications   Not on File   History   Social History  . Marital Status: Single    Spouse Name: N/A    Number of Children: N/A  . Years of Education: N/A   Occupational History  . Not on file.   Social History Main Topics  . Smoking status: Never Smoker   . Smokeless tobacco: Not on file  . Alcohol Use: No  . Drug Use: No  . Sexual Activity: Not on file   Other Topics Concern  . Not on file   Social History Narrative  . No narrative on file      Review of Systems  Skin:       abscess       Objective:   Physical Exam there is a 1 x 2 cm raised mobile area on the right posterior cervical area. There is a small droplet of purulent material which can be expressed. The area was cleaned thoroughly with Betadine and numbed with 2 cc of 2% plain incision made with a #11 blade a small amount of sebaceous material with purulence was removed packing was placed. patient tolerated the procedure well.  Filed Vitals:   02/06/13 1214  BP: 127/79    Pulse: 60  Temp: 98.6 F (37 C)  TempSrc: Oral  Resp: 18  Weight: 232 lb (105.235 kg)  SpO2: 99%         Assessment & Plan:   We'll treat with doxy 100 twice a day. packing was placed. Change packing in the morning .   I personally performed the services described in this documentation, which was scribed in my presence. The recorded information has been reviewed and is accurate.

## 2013-02-07 ENCOUNTER — Ambulatory Visit (INDEPENDENT_AMBULATORY_CARE_PROVIDER_SITE_OTHER): Payer: BC Managed Care – PPO | Admitting: Emergency Medicine

## 2013-02-07 VITALS — BP 118/80 | HR 76 | Temp 98.1°F | Resp 16 | Ht 76.0 in | Wt 231.8 lb

## 2013-02-07 DIAGNOSIS — M542 Cervicalgia: Secondary | ICD-10-CM

## 2013-02-07 DIAGNOSIS — L720 Epidermal cyst: Secondary | ICD-10-CM

## 2013-02-07 DIAGNOSIS — L723 Sebaceous cyst: Secondary | ICD-10-CM

## 2013-02-07 MED ORDER — IBUPROFEN 800 MG PO TABS: 800.0000 mg | ORAL_TABLET | Freq: Three times a day (TID) | ORAL | Status: DC | PRN

## 2013-02-07 NOTE — Progress Notes (Signed)
   Subjective:    Patient ID: Robert Schroeder, male    DOB: 1985/03/09, 28 y.o.   MRN: [id number]  HPI Patient here for followup of infected sebaceous cyst on the posterior portion of his neck yesterday. Culture was done a small amount of sebaceous debris was removed with hemostat. He returns today for repacking   Review of Systems     Objective:   Physical Exam there is no redness extending around the I&D site. Packing is still in place. We'll remove the packing try and remove more sebaceous debris and purulent material. Culture was still pending        Assessment & Plan:  Recheck in 24 hours. He'll continue doxycycline. I placed him on ibuprofen 800  3  times a day for pain. He did have a vagal reaction in the office which responded well to keeping his legs elevated and he recovered in approximately 5 minutes.

## 2013-02-07 NOTE — Progress Notes (Signed)
VCO. Local anesthesia with 2% lidocaine plain.  Existing incision extended and re-opened.  Wound was probed revealing small amount of sebaceous material.  Repacked with 1/4 plain packing.  Cleaned and dressed. Patient tolerated well.

## 2013-02-08 ENCOUNTER — Ambulatory Visit (INDEPENDENT_AMBULATORY_CARE_PROVIDER_SITE_OTHER): Payer: BC Managed Care – PPO | Admitting: Physician Assistant

## 2013-02-08 VITALS — BP 110/72 | HR 65 | Temp 98.2°F | Resp 18 | Ht 76.0 in | Wt 235.0 lb

## 2013-02-08 DIAGNOSIS — L723 Sebaceous cyst: Secondary | ICD-10-CM

## 2013-02-08 NOTE — Progress Notes (Signed)
   Subjective:    Patient ID: Robert Schroeder, male    DOB: 07/16/1985, 28 y.o.   MRN: [id number]  HPI   [name] is a 28 yr old male here for wound care following I&D of an infected sebaceous.  He reports he is doing well.  Less pain.  Taking the antibiotics as directed and tolerating them well.  He is not changing the dressing. Denies fever, chills, nausea, vomiting   Review of Systems  Constitutional: Negative for fever and chills.  Respiratory: Negative.   Cardiovascular: Negative.   Gastrointestinal: Negative.   Skin: Positive for wound.       Objective:   Physical Exam  Vitals reviewed. Constitutional: He is oriented to person, place, and time. He appears well-developed and well-nourished. No distress.  HENT:  Head: Normocephalic and atraumatic.  Eyes: Conjunctivae are normal. No scleral icterus.  Pulmonary/Chest: Effort normal.  Neurological: He is alert and oriented to person, place, and time.  Skin: Skin is warm and dry.     Healing wound at right posterior neck; continues to be indurated, but no further fluctuance; no erythema, warmth; unable to express any dranage       Assessment & Plan:  Sebaceous cyst   [name] is a 28 yr old male here for wound care following I&D of infected sebaceous cyst.  The wound appears to be healing well.  I have not repacked today.  Continue daily dressing changes until completely healed.  Pt was referred to surgery for further evaluation, encouraged him to keep this appt.  Follow up here if concerns in the mean time.   Loleta DickerE. Elizabeth Luismiguel Lamere MHS, PA-C [facility] 2/6/20152:48 PM

## 2013-02-09 LAB — WOUND CULTURE
Gram Stain: NONE SEEN
Gram Stain: NONE SEEN
Organism ID, Bacteria: NO GROWTH

## 2013-02-18 ENCOUNTER — Ambulatory Visit (INDEPENDENT_AMBULATORY_CARE_PROVIDER_SITE_OTHER): Payer: BC Managed Care – PPO | Admitting: General Surgery

## 2013-02-18 ENCOUNTER — Encounter (INDEPENDENT_AMBULATORY_CARE_PROVIDER_SITE_OTHER): Payer: Self-pay | Admitting: General Surgery

## 2013-02-18 VITALS — BP 130/80 | HR 72 | Temp 98.6°F | Resp 14 | Ht 76.0 in | Wt 228.8 lb

## 2013-02-18 DIAGNOSIS — L723 Sebaceous cyst: Secondary | ICD-10-CM | POA: Insufficient documentation

## 2013-02-18 NOTE — Progress Notes (Signed)
Subjective:     Patient ID: Robert Schroeder, male   DOB: Feb 05, 1985, 28 y.o.   MRN: [id number]  HPI The patient comes in with a healed old right posterior sebaceous cyst which has been drained and treated with oral doxycycline. He is currently asymptomatic. Otherwise healthy.  Review of Systems No fevers or chills or nausea vomiting.    Objective:   Physical Exam On examination: On the right posterior aspect of his mid neck the patient has a firm lump with a small healed incision in the center. The size is approximately 2 cm. There is no erythema and no drainage.    Assessment:     Sebaceous cyst with possible recurrence.     Plan:     The patient would like to have this removed as soon as possible. We will do so as an outpatient. He will get preoperative antibiotics.

## 2013-02-19 ENCOUNTER — Encounter (HOSPITAL_BASED_OUTPATIENT_CLINIC_OR_DEPARTMENT_OTHER): Payer: Self-pay | Admitting: *Deleted

## 2013-02-21 ENCOUNTER — Encounter (HOSPITAL_BASED_OUTPATIENT_CLINIC_OR_DEPARTMENT_OTHER): Payer: BC Managed Care – PPO | Admitting: Anesthesiology

## 2013-02-21 ENCOUNTER — Encounter (HOSPITAL_BASED_OUTPATIENT_CLINIC_OR_DEPARTMENT_OTHER): Payer: Self-pay | Admitting: *Deleted

## 2013-02-21 ENCOUNTER — Encounter (HOSPITAL_BASED_OUTPATIENT_CLINIC_OR_DEPARTMENT_OTHER)
Admission: RE | Disposition: A | Discharge status: Home / Self Care | Payer: Self-pay | Source: Ambulatory Visit | Attending: General Surgery

## 2013-02-21 ENCOUNTER — Ambulatory Visit (HOSPITAL_BASED_OUTPATIENT_CLINIC_OR_DEPARTMENT_OTHER)
Admission: RE | Admit: 2013-02-21 | Discharge: 2013-02-21 | Disposition: A | Discharge status: Home / Self Care | Payer: BC Managed Care – PPO | Source: Ambulatory Visit | Attending: General Surgery | Admitting: General Surgery

## 2013-02-21 ENCOUNTER — Ambulatory Visit (HOSPITAL_BASED_OUTPATIENT_CLINIC_OR_DEPARTMENT_OTHER): Payer: BC Managed Care – PPO | Admitting: Anesthesiology

## 2013-02-21 DIAGNOSIS — L723 Sebaceous cyst: Secondary | ICD-10-CM | POA: Insufficient documentation

## 2013-02-21 HISTORY — DX: Localized swelling, mass and lump, neck: R22.1

## 2013-02-21 HISTORY — DX: Dental restoration status: Z98.811

## 2013-02-21 HISTORY — PX: CYST REMOVAL NECK: SHX6281

## 2013-02-21 HISTORY — DX: Family history of other specified conditions: Z84.89

## 2013-02-21 SURGERY — EXCISION, CYST, NECK
Anesthesia: Monitor Anesthesia Care | Site: Neck | Laterality: Right

## 2013-02-21 MED ORDER — FENTANYL CITRATE 0.05 MG/ML IJ SOLN: 25.0000 ug | INTRAMUSCULAR | Status: DC | PRN

## 2013-02-21 MED ORDER — FENTANYL CITRATE 0.05 MG/ML IJ SOLN
INTRAMUSCULAR | Status: DC | PRN
  Administered 2013-02-21: 100 ug via INTRAVENOUS

## 2013-02-21 MED ORDER — ONDANSETRON HCL 4 MG/2ML IJ SOLN
INTRAMUSCULAR | Status: DC | PRN
  Administered 2013-02-21: 4 mg via INTRAVENOUS

## 2013-02-21 MED ORDER — LIDOCAINE HCL (CARDIAC) 20 MG/ML IV SOLN
INTRAVENOUS | Status: DC | PRN
  Administered 2013-02-21: 80 mg via INTRAVENOUS

## 2013-02-21 MED ORDER — BUPIVACAINE-EPINEPHRINE 0.25% -1:200000 IJ SOLN
INTRAMUSCULAR | Status: DC | PRN
  Administered 2013-02-21: 3.5 mL

## 2013-02-21 MED ORDER — SUCCINYLCHOLINE CHLORIDE 20 MG/ML IJ SOLN
INTRAMUSCULAR | Status: DC | PRN
  Administered 2013-02-21: 100 mg via INTRAVENOUS

## 2013-02-21 MED ORDER — BUPIVACAINE-EPINEPHRINE PF 0.25-1:200000 % IJ SOLN
INTRAMUSCULAR | Status: AC
  Filled 2013-02-21: qty 30

## 2013-02-21 MED ORDER — DEXAMETHASONE SODIUM PHOSPHATE 4 MG/ML IJ SOLN
INTRAMUSCULAR | Status: DC | PRN
  Administered 2013-02-21: 10 mg via INTRAVENOUS

## 2013-02-21 MED ORDER — MIDAZOLAM HCL 5 MG/5ML IJ SOLN
INTRAMUSCULAR | Status: DC | PRN
  Administered 2013-02-21: 2 mg via INTRAVENOUS

## 2013-02-21 MED ORDER — FENTANYL CITRATE 0.05 MG/ML IJ SOLN: 50.0000 ug | INTRAMUSCULAR | Status: DC | PRN

## 2013-02-21 MED ORDER — CEFAZOLIN SODIUM-DEXTROSE 2-3 GM-% IV SOLR
INTRAVENOUS | Status: AC
  Filled 2013-02-21: qty 50

## 2013-02-21 MED ORDER — MIDAZOLAM HCL 2 MG/ML PO SYRP: 12.0000 mg | ORAL_SOLUTION | Freq: Once | ORAL | Status: DC | PRN

## 2013-02-21 MED ORDER — PROPOFOL 10 MG/ML IV BOLUS
INTRAVENOUS | Status: DC | PRN
  Administered 2013-02-21: 250 mg via INTRAVENOUS

## 2013-02-21 MED ORDER — MIDAZOLAM HCL 2 MG/2ML IJ SOLN
INTRAMUSCULAR | Status: AC
  Filled 2013-02-21: qty 2

## 2013-02-21 MED ORDER — LACTATED RINGERS IV SOLN
INTRAVENOUS | Status: DC
  Administered 2013-02-21 (×2): via INTRAVENOUS

## 2013-02-21 MED ORDER — ONDANSETRON HCL 4 MG/2ML IJ SOLN: 4.0000 mg | Freq: Four times a day (QID) | INTRAMUSCULAR | Status: DC | PRN

## 2013-02-21 MED ORDER — MIDAZOLAM HCL 2 MG/2ML IJ SOLN: 1.0000 mg | INTRAMUSCULAR | Status: DC | PRN

## 2013-02-21 MED ORDER — FENTANYL CITRATE 0.05 MG/ML IJ SOLN
INTRAMUSCULAR | Status: AC
  Filled 2013-02-21: qty 2

## 2013-02-21 MED ORDER — CEFAZOLIN SODIUM-DEXTROSE 2-3 GM-% IV SOLR
2.0000 g | INTRAVENOUS | Status: AC
  Administered 2013-02-21: 2 g via INTRAVENOUS

## 2013-02-21 MED ORDER — LIDOCAINE HCL 4 % MT SOLN
OROMUCOSAL | Status: DC | PRN
  Administered 2013-02-21: 4 mL via TOPICAL

## 2013-02-21 MED ORDER — OXYCODONE HCL 5 MG PO TABS: 5.0000 mg | ORAL_TABLET | Freq: Once | ORAL | Status: DC | PRN

## 2013-02-21 MED ORDER — TRAMADOL HCL 50 MG PO TABS: 50.0000 mg | ORAL_TABLET | Freq: Four times a day (QID) | ORAL | Status: DC | PRN

## 2013-02-21 MED ORDER — OXYCODONE HCL 5 MG/5ML PO SOLN: 5.0000 mg | Freq: Once | ORAL | Status: DC | PRN

## 2013-02-21 SURGICAL SUPPLY — 60 items
ADH SKN CLS APL DERMABOND .7 (GAUZE/BANDAGES/DRESSINGS) ×1
APL SKNCLS STERI-STRIP NONHPOA (GAUZE/BANDAGES/DRESSINGS)
BENZOIN TINCTURE PRP APPL 2/3 (GAUZE/BANDAGES/DRESSINGS) IMPLANT
BLADE SURG 15 STRL LF DISP TIS (BLADE) ×1 IMPLANT
BLADE SURG 15 STRL SS (BLADE) ×3
BLADE SURG ROTATE 9660 (MISCELLANEOUS) IMPLANT
BNDG ADH 5X4 AIR PERM ELC (GAUZE/BANDAGES/DRESSINGS)
BNDG COHESIVE 4X5 WHT NS (GAUZE/BANDAGES/DRESSINGS) IMPLANT
BNDG GAUZE ELAST 4 BULKY (GAUZE/BANDAGES/DRESSINGS) IMPLANT
CANISTER SUCT 1200ML W/VALVE (MISCELLANEOUS) IMPLANT
CHLORAPREP W/TINT 26ML (MISCELLANEOUS) ×3 IMPLANT
CLEANER CAUTERY TIP 5X5 PAD (MISCELLANEOUS) ×1 IMPLANT
CLOSURE WOUND 1/2 X4 (GAUZE/BANDAGES/DRESSINGS)
CLOSURE WOUND 1/4X4 (GAUZE/BANDAGES/DRESSINGS) ×1
COVER MAYO STAND STRL (DRAPES) ×3 IMPLANT
COVER TABLE BACK 60X90 (DRAPES) ×3 IMPLANT
DECANTER SPIKE VIAL GLASS SM (MISCELLANEOUS) IMPLANT
DERMABOND ADVANCED (GAUZE/BANDAGES/DRESSINGS) ×2
DERMABOND ADVANCED .7 DNX12 (GAUZE/BANDAGES/DRESSINGS) IMPLANT
DRAPE LAPAROTOMY T 102X78X121 (DRAPES) ×1 IMPLANT
DRAPE PED LAPAROTOMY (DRAPES) ×2 IMPLANT
DRAPE UTILITY XL STRL (DRAPES) ×4 IMPLANT
DRSG TEGADERM 2-3/8X2-3/4 SM (GAUZE/BANDAGES/DRESSINGS) ×2 IMPLANT
DRSG TEGADERM 4X4.75 (GAUZE/BANDAGES/DRESSINGS) ×3 IMPLANT
ELECT REM PT RETURN 9FT ADLT (ELECTROSURGICAL) ×3
ELECTRODE REM PT RTRN 9FT ADLT (ELECTROSURGICAL) ×1 IMPLANT
GLOVE BIOGEL M 7.0 STRL (GLOVE) ×2 IMPLANT
GLOVE BIOGEL PI IND STRL 7.5 (GLOVE) IMPLANT
GLOVE BIOGEL PI IND STRL 8 (GLOVE) ×1 IMPLANT
GLOVE BIOGEL PI INDICATOR 7.5 (GLOVE) ×2
GLOVE BIOGEL PI INDICATOR 8 (GLOVE) ×2
GLOVE ECLIPSE 7.5 STRL STRAW (GLOVE) ×3 IMPLANT
GOWN STRL REUS W/ TWL LRG LVL3 (GOWN DISPOSABLE) ×1 IMPLANT
GOWN STRL REUS W/TWL LRG LVL3 (GOWN DISPOSABLE) ×6
NDL HYPO 25X1 1.5 SAFETY (NEEDLE) ×1 IMPLANT
NEEDLE HYPO 25X1 1.5 SAFETY (NEEDLE) ×3 IMPLANT
NS IRRIG 1000ML POUR BTL (IV SOLUTION) ×3 IMPLANT
PACK BASIN DAY SURGERY FS (CUSTOM PROCEDURE TRAY) ×3 IMPLANT
PAD CLEANER CAUTERY TIP 5X5 (MISCELLANEOUS) ×2
PENCIL BUTTON HOLSTER BLD 10FT (ELECTRODE) ×3 IMPLANT
SHEET MEDIUM DRAPE 40X70 STRL (DRAPES) IMPLANT
STRIP CLOSURE SKIN 1/2X4 (GAUZE/BANDAGES/DRESSINGS) IMPLANT
STRIP CLOSURE SKIN 1/4X4 (GAUZE/BANDAGES/DRESSINGS) ×1 IMPLANT
SUCTION FRAZIER TIP 10 FR DISP (SUCTIONS) IMPLANT
SUT ETHILON 4 0 PS 2 18 (SUTURE) ×1 IMPLANT
SUT MON AB 4-0 PC3 18 (SUTURE) ×3 IMPLANT
SUT PROLENE 4 0 PS 2 18 (SUTURE) IMPLANT
SUT VIC AB 2-0 SH 27 (SUTURE) ×3
SUT VIC AB 2-0 SH 27XBRD (SUTURE) ×2 IMPLANT
SUT VIC AB 3-0 SH 27 (SUTURE) ×3
SUT VIC AB 3-0 SH 27X BRD (SUTURE) IMPLANT
SUT VIC AB 4-0 SH 27 (SUTURE)
SUT VIC AB 4-0 SH 27XANBCTRL (SUTURE) IMPLANT
SYR BULB 3OZ (MISCELLANEOUS) ×2 IMPLANT
SYR CONTROL 10ML LL (SYRINGE) ×3 IMPLANT
TOWEL OR 17X24 6PK STRL BLUE (TOWEL DISPOSABLE) ×6 IMPLANT
TOWEL OR NON WOVEN STRL DISP B (DISPOSABLE) ×3 IMPLANT
TUBE CONNECTING 20'X1/4 (TUBING)
TUBE CONNECTING 20X1/4 (TUBING) IMPLANT
YANKAUER SUCT BULB TIP NO VENT (SUCTIONS) IMPLANT

## 2013-02-21 NOTE — Op Note (Signed)
OPERATIVE REPORT  DATE OF OPERATION: 02/21/2013  PATIENT:  Robert Schroeder  28 y.o. male  PRE-OPERATIVE DIAGNOSIS:  neck mass, 3.0 cm  POST-OPERATIVE DIAGNOSIS:  neck mass, 3.0 cm  PROCEDURE:  Procedure(s): CYST REMOVAL NECK  SURGEON:  Surgeon(s): Cherylynn RidgesJames O Norva Bowe, MD  ASSISTANT: None  ANESTHESIA:   general  EBL: <20 ml  BLOOD ADMINISTERED: none  DRAINS: none   SPECIMEN:  Source of Specimen:  Excised cystinc area  COUNTS CORRECT:  YES  PROCEDURE DETAILS: The patient was taken to the operating room and placed on the table in the supine position. After an adequate general endotracheal anesthetic was administered he was flipped and placed in the prone position protecting his that way and exposing his right posterior neck.  After a proper time out was performed identifying the patient and the procedure to be performed, the area of excision was marked with a marking pen. A total length of the incision turned out to be 4 cm. We made a oval incision around the previously incised and drained site using a #15 blade. We dissected down to subcutaneous tissue using #15 blade and also electrocautery. This is on the right posterior aspect of the neck. We taken down just superficial to the deep fascia. Electrocautery was used to obtain hemostasis. We reapproximated the subcutaneous tissue using interrupted 3-0 Vicryl suture. The skin was closed using running subcuticular stitch of 4-0 Monocryl all needle counts sponge counts and his ring counts were correct. Dermabond Steri-Strips and Tegaderm used to complete the dressing.  PATIENT DISPOSITION:  PACU - hemodynamically stable.   Cherylynn RidgesWYATT, [name] 2/19/20151:20 PM

## 2013-02-21 NOTE — H&P (View-Only) (Signed)
Subjective:     Patient ID: Robert Schroeder, male   DOB: 06/25/1985, 27 y.o.   MRN: [id number]  HPI The patient comes in with a healed old right posterior sebaceous cyst which has been drained and treated with oral doxycycline. He is currently asymptomatic. Otherwise healthy.  Review of Systems No fevers or chills or nausea vomiting.    Objective:   Physical Exam On examination: On the right posterior aspect of his mid neck the patient has a firm lump with a small healed incision in the center. The size is approximately 2 cm. There is no erythema and no drainage.    Assessment:     Sebaceous cyst with possible recurrence.     Plan:     The patient would like to have this removed as soon as possible. We will do so as an outpatient. He will get preoperative antibiotics.       

## 2013-02-21 NOTE — Anesthesia Preprocedure Evaluation (Signed)
Anesthesia Evaluation  Patient identified by MRN, date of birth, ID band Patient awake    Reviewed: Allergy & Precautions, H&P , NPO status , Patient's Chart, lab work & pertinent test results  Airway Mallampati: II  Neck ROM: full    Dental   Pulmonary neg pulmonary ROS,          Cardiovascular negative cardio ROS      Neuro/Psych  Headaches,    GI/Hepatic   Endo/Other    Renal/GU      Musculoskeletal   Abdominal   Peds  Hematology   Anesthesia Other Findings   Reproductive/Obstetrics                           Anesthesia Physical Anesthesia Plan  ASA: I  Anesthesia Plan: MAC   Post-op Pain Management:    Induction: Intravenous  Airway Management Planned: Simple Face Mask  Additional Equipment:   Intra-op Plan:   Post-operative Plan:   Informed Consent: I have reviewed the patients History and Physical, chart, labs and discussed the procedure including the risks, benefits and alternatives for the proposed anesthesia with the patient or authorized representative who has indicated his/her understanding and acceptance.     Plan Discussed with: CRNA, Anesthesiologist and Surgeon  Anesthesia Plan Comments:         Anesthesia Quick Evaluation

## 2013-02-21 NOTE — Anesthesia Procedure Notes (Signed)
Procedure Name: Intubation Date/Time: 02/21/2013 12:23 PM Performed by: Burna CashONRAD, [place] Churchill C Pre-anesthesia Checklist: Patient identified, Emergency Drugs available, Suction available and Patient being monitored Patient Re-evaluated:Patient Re-evaluated prior to inductionOxygen Delivery Method: Circle System Utilized Preoxygenation: Pre-oxygenation with 100% oxygen Intubation Type: IV induction Ventilation: Mask ventilation without difficulty Laryngoscope Size: Mac and 4 Grade View: Grade I Tube type: Oral Number of attempts: 1 Airway Equipment and Method: stylet and oral airway Placement Confirmation: ETT inserted through vocal cords under direct vision,  positive ETCO2 and breath sounds checked- equal and bilateral Secured at: 24 cm Tube secured with: Tape Dental Injury: Teeth and Oropharynx as per pre-operative assessment

## 2013-02-21 NOTE — Transfer of Care (Signed)
Immediate Anesthesia Transfer of Care Note  Patient: Robert Schroeder  Procedure(s) Performed: Procedure(s) with comments: CYST REMOVAL POSTERIOR NECK (Right) - Posterior neck.  Patient Location: PACU  Anesthesia Type:General  Level of Consciousness: sedated  Airway & Oxygen Therapy: Patient Spontanous Breathing and Patient connected to face mask oxygen  Post-op Assessment: Report given to PACU RN and Post -op Vital signs reviewed and stable  Post vital signs: Reviewed and stable  Complications: No apparent anesthesia complications

## 2013-02-21 NOTE — Anesthesia Postprocedure Evaluation (Signed)
Anesthesia Post Note  Patient: Robert Schroeder  Procedure(s) Performed: Procedure(s) (LRB): CYST REMOVAL POSTERIOR NECK (Right)  Anesthesia type: General  Patient location: PACU  Post pain: Pain level controlled and Adequate analgesia  Post assessment: Post-op Vital signs reviewed, Patient's Cardiovascular Status Stable, Respiratory Function Stable, Patent Airway and Pain level controlled  Last Vitals:  Filed Vitals:   02/21/13 1430  BP: 133/90  Pulse: 61  Temp: 36.4 C  Resp: 16    Post vital signs: Reviewed and stable  Level of consciousness: awake, alert  and oriented  Complications: No apparent anesthesia complications

## 2013-02-21 NOTE — Interval H&P Note (Signed)
History and Physical Interval Note:  02/21/2013 1:25 PM  Robert Schroeder  has presented today for surgery, with the diagnosis of neck mass  The various methods of treatment have been discussed with the patient and family. After consideration of risks, benefits and other options for treatment, the patient has consented to  Procedure(s) with comments: CYST REMOVAL POSTERIOR NECK (Right) - Posterior neck. as a surgical intervention .  The patient's history has been reviewed, patient examined, no change in status, stable for surgery.  I have reviewed the patient's chart and labs.  Questions were answered to the patient's satisfaction.     Aunica Dauphinee, Marta LamasJAMES O

## 2013-02-21 NOTE — Discharge Instructions (Addendum)
Epidermal Cyst An epidermal cyst is sometimes called a sebaceous cyst, epidermal inclusion cyst, or infundibular cyst. These cysts usually contain a substance that looks "pasty" or "cheesy" and may have a bad smell. This substance is a protein called keratin. Epidermal cysts are usually found on the face, neck, or trunk. They may also occur in the vaginal area or other parts of the genitalia of both men and women. Epidermal cysts are usually small, painless, slow-growing bumps or lumps that move freely under the skin. It is important not to try to pop them. This may cause an infection and lead to tenderness and swelling. CAUSES  Epidermal cysts may be caused by a deep penetrating injury to the skin or a plugged hair follicle, often associated with acne. SYMPTOMS  Epidermal cysts can become inflamed and cause:  Redness.  Tenderness.  Increased temperature of the skin over the bumps or lumps.  Grayish-white, bad smelling material that drains from the bump or lump. DIAGNOSIS  Epidermal cysts are easily diagnosed by your caregiver during an exam. Rarely, a tissue sample (biopsy) may be taken to rule out other conditions that may resemble epidermal cysts.  Your cyst has been excised and keeping the surgical site clean and dry is important TREATMENT   Epidermal cysts often get better and disappear on their own. They are rarely ever cancerous.  If a cyst becomes infected, it may become inflamed and tender. This may require opening and draining the cyst. Treatment with antibiotics may be necessary. When the infection is gone, the cyst may be removed with minor surgery--that is what you had done today.  Small, inflamed cysts can often be treated with antibiotics or by injecting steroid medicines.  Sometimes, epidermal cysts become large and bothersome. If this happens, surgical removal in your caregiver's office may be necessary. HOME CARE INSTRUCTIONS  Only take over-the-counter or prescription  medicines as directed by your caregiver.  No antibiotics needed at this time  Take pain medications as needed  Keep dressing intact SEEK MEDICAL CARE IF:   Your incision becomes tender, red, or swollen.  Your condition is not improving or is getting worse.  You have any other questions or concerns. MAKE SURE YOU:  Understand these instructions.  Will watch your condition.  Will get help right away if you are not doing well or get worse. Document Released: 11/21/2003 Document Revised: 03/14/2011 Document Reviewed: 06/28/2010 [facility] Patient Information 2014 [place]Care, [place].    Post Anesthesia Home Care Instructions  Activity: Get plenty of rest for the remainder of the day. A responsible adult should stay with you for 24 hours following the procedure.  For the next 24 hours, DO NOT: -Drive a car -Advertising copywriterperate machinery -Drink alcoholic beverages -Take any medication unless instructed by your physician -Make any legal decisions or sign important papers.  Meals: Start with liquid foods such as gelatin or soup. Progress to regular foods as tolerated. Avoid greasy, spicy, heavy foods. If nausea and/or vomiting occur, drink only clear liquids until the nausea and/or vomiting subsides. Call your physician if vomiting continues.  Special Instructions/Symptoms: Your throat may feel dry or sore from the anesthesia or the breathing tube placed in your throat during surgery. If this causes discomfort, gargle with warm salt water. The discomfort should disappear within 24 hours.

## 2013-02-22 ENCOUNTER — Encounter (HOSPITAL_BASED_OUTPATIENT_CLINIC_OR_DEPARTMENT_OTHER): Payer: Self-pay | Admitting: General Surgery

## 2013-02-22 LAB — POCT HEMOGLOBIN-HEMACUE: Hemoglobin: 14.4 g/dL (ref 13.0–17.0)

## 2013-02-24 ENCOUNTER — Ambulatory Visit (INDEPENDENT_AMBULATORY_CARE_PROVIDER_SITE_OTHER): Payer: BC Managed Care – PPO | Admitting: Physician Assistant

## 2013-02-24 VITALS — BP 120/82 | HR 55 | Temp 97.9°F | Resp 18 | Ht 76.0 in | Wt 235.0 lb

## 2013-02-24 DIAGNOSIS — L723 Sebaceous cyst: Secondary | ICD-10-CM

## 2013-02-25 NOTE — Progress Notes (Signed)
   Subjective:    Patient ID: Robert Schroeder, male    DOB: [birth year], 28 y.o.   MRN: [id number]   PCP: Mickie HillierLITTLE,KEVIN LORNE, MD  Chief Complaint  Patient presents with  . Wound Check    Medications, allergies, past medical history, surgical history, family history, social history and problem list reviewed and updated.   HPI  Presents for wound check after surgical excision of a sebaceous cyst on the back of his neck 4 days ago with Dr. Lindie SpruceWyatt.  The cyst was evaluated here on 2/04 and he underwent I&D with packing and started on doxycycline. He returned x 2 for wound care and was referred to general surgery for excision of the lesion to prevent recurrence.   The surgical site is sore, but he denies any drainage. The pain is decreasing. He denies fever, chills, swelling. He has a bandage on the surgical site that he describes as pulling and uncomfortable and would like it assessed.  He has a 2-week follow-up with Dr. Lindie SpruceWyatt.  Review of Systems As above.    Objective:   Physical Exam  BP 120/82  Pulse 55  Temp(Src) 97.9 F (36.6 C) (Oral)  Resp 18  Ht 6\' 4"  (1.93 m)  Wt 235 lb (106.595 kg)  BMI 28.62 kg/m2  SpO2 97% WDWN man, A&O x 3.  Posterior neck notable for a small Tegaderm bandage over steri-strips. The edges of the Tegaderm are curling away from the skin and "wadded up" a bit, lifting the underlying steri-strips. The Tegaderm was removed with care to prevent removal of the steri-strips, which were then trimmed of the ends no longer adhering to the skin.  The skin glue along the actual incision appears intact.  There is no drainage, erythema or edema.  The tissue is firm, consistent with post-surgical changes and the inflammatory changes of the initial lesion.  Minimal tenderness.  A non-adherent gauze and Hypa-fix bandage applied.    Assessment & Plan:  1. Sebaceous cyst Local wound care. Encouraged to leave the wound open to the air, but he wants to keep it covered at  work, which is appropriate. Dressing changes daily. Follow-up with Dr. Lindie SpruceWyatt as planned.   Fernande Brashelle S. Omari Mcmanaway, PA-C Physician Assistant-Certified [facility]

## 2013-03-01 ENCOUNTER — Telehealth (INDEPENDENT_AMBULATORY_CARE_PROVIDER_SITE_OTHER): Payer: Self-pay

## 2013-03-01 NOTE — Telephone Encounter (Signed)
Pt called stating his bandage has come off. Pt wanting to know if he needs to keep bandage on area. No drainage. No fever. Pt advised he can shower and pat area dry. Pt to apply bandage if area is being rubbed by clothes. Pt states he understands and will call with any concerns.

## 2013-03-11 ENCOUNTER — Telehealth (INDEPENDENT_AMBULATORY_CARE_PROVIDER_SITE_OTHER): Payer: Self-pay

## 2013-03-11 NOTE — Telephone Encounter (Signed)
Pt calling for po appt with Dr Lindie SpruceWyatt. I advised him that msg will be sent to Dr Dixon BoosWyatt's assistant to view schedule and work pt in. Pt has a bit of seroma but not red or feverish. Pt offered appt with Urg MD but pt declined. Pt states unless area becomes red,inflammed or painful he will wait for his po appt with Dr Lindie SpruceWyatt. Pt can be reached at [phone].

## 2013-03-12 NOTE — Telephone Encounter (Signed)
Called pt with appt info 

## 2013-03-19 ENCOUNTER — Ambulatory Visit (INDEPENDENT_AMBULATORY_CARE_PROVIDER_SITE_OTHER): Payer: BC Managed Care – PPO | Admitting: General Surgery

## 2013-03-19 VITALS — BP 134/74 | HR 80 | Temp 97.9°F | Resp 16 | Ht 76.0 in | Wt 232.0 lb

## 2013-03-19 DIAGNOSIS — Z09 Encounter for follow-up examination after completed treatment for conditions other than malignant neoplasm: Secondary | ICD-10-CM

## 2013-03-19 NOTE — Progress Notes (Signed)
Subjective:     Patient ID: Robert Schroeder, male   DOB: October 16, 1985, 28 y.o.   MRN: [id number]  HPI Patient is status post excision of right posterior neck cyst. He is doing well. There is no evidence of infection.  Review of Systems No problems.    Objective:   Physical Exam On the right lateral aspect of the incision is a small piece of the Monocryl suture there to come to the corner of the incision. This was cut out and removed. There is no evidence of posterior infection.    Assessment:     Doing well postoperatively after excision of right posterior neck cyst. The patient has no problems with shrugging his shoulders and lifting his shoulders or moving his right arm. There is no evidence of injury to the spinal accessory nerve.     Plan:     Return to see me on an as-needed basis.  The pathology demonstrates findings consistent with a ruptured cyst.

## 2013-03-21 ENCOUNTER — Encounter (HOSPITAL_BASED_OUTPATIENT_CLINIC_OR_DEPARTMENT_OTHER): Payer: Self-pay | Admitting: Emergency Medicine

## 2013-03-21 ENCOUNTER — Emergency Department (HOSPITAL_BASED_OUTPATIENT_CLINIC_OR_DEPARTMENT_OTHER)
Admission: EM | Admit: 2013-03-21 | Discharge: 2013-03-21 | Disposition: A | Discharge status: Home / Self Care | Payer: Worker's Compensation | Attending: Emergency Medicine | Admitting: Emergency Medicine

## 2013-03-21 ENCOUNTER — Emergency Department (HOSPITAL_BASED_OUTPATIENT_CLINIC_OR_DEPARTMENT_OTHER): Payer: Worker's Compensation

## 2013-03-21 DIAGNOSIS — X500XXA Overexertion from strenuous movement or load, initial encounter: Secondary | ICD-10-CM | POA: Insufficient documentation

## 2013-03-21 DIAGNOSIS — Y990 Civilian activity done for income or pay: Secondary | ICD-10-CM | POA: Insufficient documentation

## 2013-03-21 DIAGNOSIS — IMO0002 Reserved for concepts with insufficient information to code with codable children: Secondary | ICD-10-CM | POA: Insufficient documentation

## 2013-03-21 DIAGNOSIS — S8392XA Sprain of unspecified site of left knee, initial encounter: Secondary | ICD-10-CM

## 2013-03-21 DIAGNOSIS — Y939 Activity, unspecified: Secondary | ICD-10-CM | POA: Insufficient documentation

## 2013-03-21 DIAGNOSIS — Y9289 Other specified places as the place of occurrence of the external cause: Secondary | ICD-10-CM | POA: Insufficient documentation

## 2013-03-21 MED ORDER — HYDROCODONE-ACETAMINOPHEN 5-325 MG PO TABS
1.0000 | ORAL_TABLET | ORAL | Status: DC | PRN
Start: 2013-03-21 — End: 2013-06-27

## 2013-03-21 MED ORDER — IBUPROFEN 800 MG PO TABS: 800.0000 mg | ORAL_TABLET | Freq: Three times a day (TID) | ORAL | Status: AC

## 2013-03-21 NOTE — ED Notes (Signed)
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

## 2013-03-21 NOTE — ED Notes (Signed)
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 Pt in no acute distress on assessment.

## 2013-03-21 NOTE — Discharge Instructions (Signed)
Joint Sprain °A sprain is a tear or stretch in the ligaments that hold a joint together. Severe sprains may need as long as 3-6 weeks of immobilization and/or exercises to heal completely. Sprained joints should be rested and protected. If not, they can become unstable and prone to re-injury. Proper treatment can reduce your pain, shorten the period of disability, and reduce the risk of repeated injuries. °TREATMENT  °· Rest and elevate the injured joint to reduce pain and swelling. °· Apply ice packs to the injury for 20-30 minutes every 2-3 hours for the next 2-3 days. °· Keep the injury wrapped in a compression bandage or splint as long as the joint is painful or as instructed by your caregiver. °· Do not use the injured joint until it is completely healed to prevent re-injury and chronic instability. Follow the instructions of your caregiver. °· Long-term sprain management may require exercises and/or treatment by a physical therapist. Taping or special braces may help stabilize the joint until it is completely better. °SEEK MEDICAL CARE IF:  °· You develop increased pain or swelling of the joint. °· You develop increasing redness and warmth of the joint. °· You develop a fever. °· It becomes stiff. °· Your hand or foot gets cold or numb. °Document Released: 01/28/2004 Document Revised: 03/14/2011 Document Reviewed: 01/07/2008 °ExitCare® Patient Information ©2014 ExitCare, LLC. ° °Cryotherapy °Cryotherapy means treatment with cold. Ice or gel packs can be used to reduce both pain and swelling. Ice is the most helpful within the first 24 to 48 hours after an injury or flareup from overusing a muscle or joint. Sprains, strains, spasms, burning pain, shooting pain, and aches can all be eased with ice. Ice can also be used when recovering from surgery. Ice is effective, has very few side effects, and is safe for most people to use. °PRECAUTIONS  °Ice is not a safe treatment option for people with: °· Raynaud's  phenomenon. This is a condition affecting small blood vessels in the extremities. Exposure to cold may cause your problems to return. °· Cold hypersensitivity. There are many forms of cold hypersensitivity, including: °· Cold urticaria. Red, itchy hives appear on the skin when the tissues begin to warm after being iced. °· Cold erythema. This is a red, itchy rash caused by exposure to cold. °· Cold hemoglobinuria. Red blood cells break down when the tissues begin to warm after being iced. The hemoglobin that carry oxygen are passed into the urine because they cannot combine with blood proteins fast enough. °· Numbness or altered sensitivity in the area being iced. °If you have any of the following conditions, do not use ice until you have discussed cryotherapy with your caregiver: °· Heart conditions, such as arrhythmia, angina, or chronic heart disease. °· High blood pressure. °· Healing wounds or open skin in the area being iced. °· Current infections. °· Rheumatoid arthritis. °· Poor circulation. °· Diabetes. °Ice slows the blood flow in the region it is applied. This is beneficial when trying to stop inflamed tissues from spreading irritating chemicals to surrounding tissues. However, if you expose your skin to cold temperatures for too long or without the proper protection, you can damage your skin or nerves. Watch for signs of skin damage due to cold. °HOME CARE INSTRUCTIONS °Follow these tips to use ice and cold packs safely. °· Place a dry or damp towel between the ice and skin. A damp towel will cool the skin more quickly, so you may need to shorten the   time that the ice is used. °· For a more rapid response, add gentle compression to the ice. °· Ice for no more than 10 to 20 minutes at a time. The bonier the area you are icing, the less time it will take to get the benefits of ice. °· Check your skin after 5 minutes to make sure there are no signs of a poor response to cold or skin damage. °· Rest 20  minutes or more in between uses. °· Once your skin is numb, you can end your treatment. You can test numbness by very lightly touching your skin. The touch should be so light that you do not see the skin dimple from the pressure of your fingertip. When using ice, most people will feel these normal sensations in this order: cold, burning, aching, and numbness. °· Do not use ice on someone who cannot communicate their responses to pain, such as small children or people with dementia. °HOW TO MAKE AN ICE PACK °Ice packs are the most common way to use ice therapy. Other methods include ice massage, ice baths, and cryo-sprays. Muscle creams that cause a cold, tingly feeling do not offer the same benefits that ice offers and should not be used as a substitute unless recommended by your caregiver. °To make an ice pack, do one of the following: °· Place crushed ice or a bag of frozen vegetables in a sealable plastic bag. Squeeze out the excess air. Place this bag inside another plastic bag. Slide the bag into a pillowcase or place a damp towel between your skin and the bag. °· Mix 3 parts water with 1 part rubbing alcohol. Freeze the mixture in a sealable plastic bag. When you remove the mixture from the freezer, it will be slushy. Squeeze out the excess air. Place this bag inside another plastic bag. Slide the bag into a pillowcase or place a damp towel between your skin and the bag. °SEEK MEDICAL CARE IF: °· You develop white spots on your skin. This may give the skin a blotchy (mottled) appearance. °· Your skin turns blue or pale. °· Your skin becomes waxy or hard. °· Your swelling gets worse. °MAKE SURE YOU:  °· Understand these instructions. °· Will watch your condition. °· Will get help right away if you are not doing well or get worse. °Document Released: 08/16/2010 Document Revised: 03/14/2011 Document Reviewed: 08/16/2010 °ExitCare® Patient Information ©2014 ExitCare, LLC. ° °

## 2013-03-21 NOTE — ED Provider Notes (Signed)
CSN: [id number]     Arrival date & time 03/21/13  2028 History   First MD Initiated Contact with Patient 03/21/13 2057     Chief Complaint  Patient presents with  . Knee Injury     (Consider location/radiation/quality/duration/timing/severity/associated sxs/prior Treatment) HPI Comments: Patient is a 28 year-old male who presents with complaints of knee pain after suffering impact to the lateral aspect of his left leg while standing. He heard a pop at the time of impact. The injury occurred at 10:30am this morning and the patient has been in about 6/10 pain since. He has tried ice and elevation neither of which have reduced the pain. The patient is able to walk, though he now limps when weight bearing. He has never had an injury to this knee before.   The history is provided by the patient. No language interpreter was used.    Past Medical History  Diagnosis Date  . Neck mass 02/2013    right  . Dental crowns present     x 2  . Family history of anesthesia complication     states father has high tolerance to anesthesia; takes a lot to anesthetize   Past Surgical History  Procedure Laterality Date  . No past surgeries    . Cyst removal neck Right 02/21/2013    Procedure: CYST REMOVAL POSTERIOR NECK;  Surgeon: Cherylynn Ridges, MD;  Location: [facility];  Service: General;  Laterality: Right;  Posterior neck.   Family History  Problem Relation Age of Onset  . Thyroid disease Mother   . Hypertension Mother   . Cancer Father     neyamiya psycoma  . Hyperlipidemia Father   . Anesthesia problems Father     states has a high tolerance to anesthesia  . Hypertension Maternal Grandmother   . Alcohol abuse Maternal Grandfather   . Heart disease Paternal Grandmother   . Heart disease Paternal Grandfather    History  Substance Use Topics  . Smoking status: Never Smoker   . Smokeless tobacco: Never Used  . Alcohol Use: Yes     Comment: occasionally    Review of Systems   Constitutional: Negative for fever and chills.  Musculoskeletal: Negative.        See HPI  Skin: Negative.   Neurological: Negative.       Allergies  Mushroom extract complex  Home Medications  No current outpatient prescriptions on file. BP 157/68  Pulse 66  Temp(Src) 98.4 F (36.9 C) (Oral)  Resp 18  Ht 6\' 3"  (1.905 m)  Wt 235 lb (106.595 kg)  BMI 29.37 kg/m2  SpO2 100% Physical Exam  Constitutional: He appears well-developed and well-nourished. No distress.  Cardiovascular: Normal rate, normal heart sounds and intact distal pulses.   Pulmonary/Chest: Effort normal and breath sounds normal.  Abdominal: Soft. There is no tenderness.  Musculoskeletal:       Left knee: He exhibits decreased range of motion, swelling and bony tenderness. He exhibits no effusion, no ecchymosis, no deformity, no laceration, no erythema, normal alignment, no LCL laxity, normal patellar mobility and no MCL laxity. Tenderness found. Medial joint line and patellar tendon tenderness noted.  Neurological: He is alert. He has normal reflexes.  Skin: Skin is warm and dry.    ED Course  Procedures (including critical care time) Labs Review Labs Reviewed - No data to display Imaging Review Dg Knee Complete 4 Views Left  03/21/2013   CLINICAL DATA:  Left knee pain, injury at  patellar radiating medially  EXAM: LEFT KNEE - COMPLETE 4+ VIEW  COMPARISON:  10/19/2004  FINDINGS: Osseous mineralization normal.  Joint spaces preserved.  Calcific density seen adjacent to the lower margin of the inferior patella, appears somewhat amorphous, atypical for acute fracture.  This could represent soft tissue calcification or sequela of prior injury.  No additional fracture, dislocation or bone destruction.  No knee joint effusion.  IMPRESSION: Question soft tissue calcification adjacent to the inferior margin of the patella laterally, potentially representing sequela of a previous injury; this does not have the typical  appearance of an acute fractures.  If patient has persistent pain, consider followup nonemergent MR imaging.   Electronically Signed   By: Ulyses SouthwardMark  Boles M.D.   On: 03/21/2013 21:16     EKG Interpretation None      MDM   Final diagnoses:  None    1. Left knee sprain  Uncomplicated knee sprain with stable joint. Immobilizer and crutches provided along with pain management.     Arnoldo HookerShari A Obaloluwa Delatte, PA-C 03/25/13 0121

## 2013-03-25 NOTE — ED Provider Notes (Signed)
  Medical screening examination/treatment/procedure(s) were performed by non-physician practitioner and as supervising physician I was immediately available for consultation/collaboration.   EKG Interpretation None         Kailana Benninger, MD 03/25/13 1532 

## 2013-06-27 ENCOUNTER — Ambulatory Visit (INDEPENDENT_AMBULATORY_CARE_PROVIDER_SITE_OTHER): Payer: BC Managed Care – PPO | Admitting: Family Medicine

## 2013-06-27 VITALS — BP 124/82 | HR 53 | Temp 98.4°F | Resp 14 | Ht 75.0 in | Wt 232.0 lb

## 2013-06-27 DIAGNOSIS — Z7182 Exercise counseling: Secondary | ICD-10-CM

## 2013-06-27 NOTE — Patient Instructions (Signed)
Good to see you today- take care  Let me know if your left shoulder is not continuing to improve Do some "wall crawls" with your fingers to maintain your shoulder range of motion  I am sorry that you had this fall- please let me know if you don't continue getting your strength back Assuming all is well please see me in about 4-6 months  

## 2013-06-27 NOTE — Progress Notes (Signed)
[facility] [address], [place] [id number] [phone]  Date:  06/27/2013   Name:  Robert Schroeder   DOB:  [birth year]   MRN:  [id number]  PCP:  Mickie HillierLITTLE,KEVIN LORNE, MD    Chief Complaint: release of care form   History of Present Illness:  Robert Schroeder is a 28 y.o. very pleasant male patient who presents with the following:  Here today needing a "release of care letter."  He is entering the [employer], and needs a statement that he does not have any physical limitations.  He was told that he needs a letter, no particular form to fill out.    He is generally healthy.  He did have an episode of syncope a couple of years ago; he was shopping, had just played several basketball games.  After this episode he had a stress test and echo all of which were normal.  He has not had any further syncope or any other health problems   He is active, likes to play basketball and run.  He participates in a running program.   No further syncope, no chest pain.   No major MSK issues.   Patient Active Problem List   Diagnosis Date Noted  . Postop check 03/19/2013  . Sebaceous cyst 02/18/2013  . Chest pain   . Headache   . Syncope 10/06/2011    Past Medical History  Diagnosis Date  . Neck mass 02/2013    right  . Dental crowns present     x 2  . Family history of anesthesia complication     states father has high tolerance to anesthesia; takes a lot to anesthetize    Past Surgical History  Procedure Laterality Date  . No past surgeries    . Cyst removal neck Right 02/21/2013    Procedure: CYST REMOVAL POSTERIOR NECK;  Surgeon: Cherylynn RidgesJames O Wyatt, MD;  Location: [facility];  Service: General;  Laterality: Right;  Posterior neck.    History  Substance Use Topics  . Smoking status: Never Smoker   . Smokeless tobacco: Never Used  . Alcohol Use: Yes     Comment: occasionally    Family History  Problem Relation Age of Onset  . Thyroid disease Mother    . Hypertension Mother   . Cancer Father     neyamiya psycoma  . Hyperlipidemia Father   . Anesthesia problems Father     states has a high tolerance to anesthesia  . Hypertension Maternal Grandmother   . Alcohol abuse Maternal Grandfather   . Heart disease Paternal Grandmother   . Heart disease Paternal Grandfather     Allergies  Allergen Reactions  . Mushroom Extract Complex Shortness Of Breath and Swelling    Medication list has been reviewed and updated.  Current Outpatient Prescriptions on File Prior to Visit  Medication Sig Dispense Refill  . HYDROcodone-acetaminophen (NORCO/VICODIN) 5-325 MG per tablet Take 1-2 tablets by mouth every 4 (four) hours as needed.  12 tablet  0  . ibuprofen (ADVIL,MOTRIN) 800 MG tablet Take 1 tablet (800 mg total) by mouth 3 (three) times daily.  21 tablet  0   No current facility-administered medications on file prior to visit.    Review of Systems:  As per HPI- otherwise negative.   Physical Examination: Filed Vitals:   06/27/13 1006  BP: 124/82  Pulse: 53  Temp: 98.4 F (36.9 C)  Resp: 14   Filed Vitals:  06/27/13 1006  Height: 6\' 3"  (1.905 m)  Weight: 232 lb (105.235 kg)   Body mass index is 29 kg/(m^2). Ideal Body Weight: Weight in (lb) to have BMI = 25: 199.6  GEN: WDWN, NAD, Non-toxic, A & O x 3, looks well, fit build HEENT: Atraumatic, Normocephalic. Neck supple. No masses, No LAD. Ears and Nose: No external deformity. CV: RRR, No M/G/R. No JVD. No thrill. No extra heart sounds. PULM: CTA B, no wheezes, crackles, rhonchi. No retractions. No resp. distress. No accessory muscle use. ABD: S, NT, ND, +BS. No rebound. No HSM. EXTR: No c/c/e NEURO Normal gait.  PSYCH: Normally interactive. Conversant. Not depressed or anxious appearing.  Calm demeanor.    Assessment and Plan: Exercise counseling  Did letter for him to participate in physical activity without limitations as per his request.    Signed Abbe AmsterdamJessica  Copland, MD

## 2014-01-03 HISTORY — PX: KNEE SURGERY: SHX244

## 2015-03-09 ENCOUNTER — Ambulatory Visit (INDEPENDENT_AMBULATORY_CARE_PROVIDER_SITE_OTHER): Payer: BC Managed Care – PPO | Admitting: Physician Assistant

## 2015-03-09 VITALS — BP 128/80 | HR 73 | Temp 99.3°F | Resp 18 | Ht 75.0 in | Wt 244.8 lb

## 2015-03-09 DIAGNOSIS — R112 Nausea with vomiting, unspecified: Secondary | ICD-10-CM

## 2015-03-09 DIAGNOSIS — K529 Noninfective gastroenteritis and colitis, unspecified: Secondary | ICD-10-CM | POA: Diagnosis not present

## 2015-03-09 MED ORDER — ONDANSETRON 4 MG PO TBDP
4.0000 mg | ORAL_TABLET | Freq: Once | ORAL | Status: AC
  Administered 2015-03-09: 4 mg via ORAL

## 2015-03-09 MED ORDER — ONDANSETRON HCL 4 MG PO TABS: 4.0000 mg | ORAL_TABLET | Freq: Three times a day (TID) | ORAL | Status: DC | PRN

## 2015-03-09 NOTE — Patient Instructions (Signed)

## 2015-03-09 NOTE — Progress Notes (Signed)
   03/09/2015 4:54 PM   DOB: 12-Sep-1985 / MRN: [id number]  SUBJECTIVE:  Robert Schroeder is a 30 y.o. male presenting for nausea, diarrhea, vomitting and fatigue that has been present for three days.  [place] food and became il 3 hours after the meal. He has been feeling better since Saturday and denies hematochezia and hematemesis. He is only having mild nausea at this time.   He is allergic to mushroom extract complex.   He  has a past medical history of Neck mass (02/2013); Dental crowns present; and Family history of anesthesia complication.    He  reports that he has never smoked. He has never used smokeless tobacco. He reports that he drinks alcohol. He reports that he does not use illicit drugs. He  has no sexual activity history on file. The patient  has past surgical history that includes No past surgeries; Cyst removal neck (Right, 02/21/2013); and Knee surgery (2016).  His family history includes Alcohol abuse in his maternal grandfather; Anesthesia problems in his father; Cancer in his father; Heart disease in his paternal grandfather and paternal grandmother; Hyperlipidemia in his father; Hypertension in his maternal grandmother and mother; Thyroid disease in his mother.  Review of Systems  Constitutional: Positive for malaise/fatigue.  Gastrointestinal: Negative for diarrhea.  Genitourinary: Negative for dysuria.  Skin: Negative for rash.  Neurological: Negative for dizziness and headaches.    Problem list and medications reviewed and updated by myself where necessary, and exist elsewhere in the encounter.   OBJECTIVE:  BP 128/80 mmHg  Pulse 73  Temp(Src) 99.3 F (37.4 C) (Oral)  Resp 18  Ht 6\' 3"  (1.905 m)  Wt 244 lb 12.8 oz (111.041 kg)  BMI 30.60 kg/m2  SpO2 98%  Physical Exam  Constitutional: He is oriented to person, place, and time. He appears well-developed. He does not appear ill.  Eyes: Conjunctivae and EOM are normal. Pupils are  equal, round, and reactive to light.  Cardiovascular: Normal rate.   Pulmonary/Chest: Effort normal.  Abdominal: Soft. Bowel sounds are normal. He exhibits no distension and no mass. There is no tenderness. There is no rebound and no guarding.  Musculoskeletal: Normal range of motion.  Neurological: He is alert and oriented to person, place, and time. No cranial nerve deficit. Coordination normal.  Skin: Skin is warm and dry. He is not diaphoretic.  Psychiatric: He has a normal mood and affect.  Nursing note and vitals reviewed.   No results found for this or any previous visit (from the past 72 hour(s)).  No results found.  ASSESSMENT AND PLAN  Robert Schroeder was seen today for abdominal pain, diarrhea and fatigue.  Diagnoses and all orders for this visit:  Acute gastroenteritis: Resolving. Exam reassuring.   RTC as needed.    Nausea and vomiting, intractability of vomiting not specified, unspecified vomiting type -     ondansetron (ZOFRAN-ODT) disintegrating tablet 4 mg; Take 1 tablet (4 mg total) by mouth once. -     ondansetron (ZOFRAN) 4 MG tablet; Take 1 tablet (4 mg total) by mouth every 8 (eight) hours as needed for nausea or vomiting.    The patient was advised to call or return to clinic if he does not see an improvement in symptoms or to seek the care of the closest emergency department if he worsens with the above plan.   Deliah BostonMichael Chanon Loney, MHS, PA-C [facility] 03/09/2015 4:54 PM

## 2015-03-15 ENCOUNTER — Ambulatory Visit (INDEPENDENT_AMBULATORY_CARE_PROVIDER_SITE_OTHER): Payer: BC Managed Care – PPO | Admitting: Family Medicine

## 2015-03-15 VITALS — BP 122/72 | HR 73 | Temp 98.4°F | Resp 17 | Ht 75.0 in | Wt 241.0 lb

## 2015-03-15 DIAGNOSIS — Z7251 High risk heterosexual behavior: Secondary | ICD-10-CM

## 2015-03-15 NOTE — Patient Instructions (Signed)
Chlamydia Test  WHY AM I HAVING THIS TEST?  This a test to see if you have chlamydia. Chlamydia is a common sexually transmitted disease (STD). Your health care provider may perform this test if you:  · Are sexually active.  · Have another STD.  · Have complaints about pelvic pain, vaginal discharge, or both.  WHAT KIND OF SAMPLE IS TAKEN?  Depending on your symptoms, your health care provider may collect any one of the following samples:  · A blood sample. This is usually collected by inserting a needle into a vein.  · A tissue sample. This is collected by swabbing tissue of the eye, urethra, or cervix.  · A sample of sputum. This is collected by having you cough into a sterile container that is provided by the lab.  HOW DO I PREPARE FOR THE TEST?  There is no preparation required for this test.  HOW ARE THE TEST RESULTS REPORTED?  Your test results will be reported as either positive or negative. It is your responsibility to obtain your test results. Ask the lab or department performing the test when and how you will get your results.  WHAT DO THE RESULTS MEAN?  A positive result means that you have a chlamydia infection.  Talk with your health care provider to discuss your results, treatment options, and if necessary, the need for more tests. Talk with your health care provider if you have any questions about your results.     This information is not intended to replace advice given to you by your health care provider. Make sure you discuss any questions you have with your health care provider.     Document Released: 01/13/2004 Document Revised: 01/10/2014 Document Reviewed: 05/15/2013  Elsevier Interactive Patient Education ©2016 Elsevier Inc.

## 2015-03-15 NOTE — Progress Notes (Signed)
   Subjective:    Patient ID: Robert Schroeder, male    DOB: [birth year], 30 y.o.   MRN: [id number] By signing my name below, I, Robert Schroeder, attest that this documentation has been prepared under the direction and in the presence of Robert SidleKurt Bryn Saline, MD.  Electronically Signed: Littie Deedsichard Schroeder, Medical Scribe. 03/15/2015. 11:39 AM.  HPI HPI Comments: Robert SilviusZuri Z Hayne is a 30 y.o. male who presents to the [facility] requesting an STD check. Patient had unprotected sexual intercourse 3 days ago with a male. Patient denies rash and dysuria.  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  Review of Systems  Genitourinary: Negative for dysuria.  Skin: Negative for rash.       Objective:   Physical Exam CONSTITUTIONAL: Well developed/well nourished HEAD: Normocephalic/atraumatic EYES: EOM/PERRL ENMT: Mucous membranes moist NECK: supple no meningeal signs SPINE: entire spine nontender CV: S1/S2 noted, no murmurs/rubs/gallops noted LUNGS: Lungs are clear to auscultation bilaterally, no apparent distress ABDOMEN: soft, nontender, no rebound or guarding GU: no cva tenderness NEURO: Pt is awake/alert, moves all extremitiesx4 EXTREMITIES: pulses normal, full ROM SKIN: warm, color normal PSYCH: no abnormalities of mood noted        Assessment & Plan:   This chart was scribed in my presence and reviewed by me personally.    ICD-9-CM ICD-10-CM   1. Unprotected sex V69.2 34Z72.51 GC/Chlamydia Probe Amp     Signed, Robert SidleKurt Oleva Koo, MD

## 2015-03-16 LAB — GC/CHLAMYDIA PROBE AMP
CT Probe RNA: NOT DETECTED
GC Probe RNA: NOT DETECTED

## 2016-03-19 ENCOUNTER — Ambulatory Visit (INDEPENDENT_AMBULATORY_CARE_PROVIDER_SITE_OTHER): Payer: 59

## 2016-03-19 ENCOUNTER — Encounter: Payer: Self-pay | Admitting: Family Medicine

## 2016-03-19 ENCOUNTER — Ambulatory Visit (INDEPENDENT_AMBULATORY_CARE_PROVIDER_SITE_OTHER): Payer: 59 | Admitting: Family Medicine

## 2016-03-19 VITALS — BP 138/74 | HR 72 | Temp 99.1°F | Resp 16 | Ht 75.5 in | Wt 246.0 lb

## 2016-03-19 DIAGNOSIS — M25571 Pain in right ankle and joints of right foot: Secondary | ICD-10-CM | POA: Diagnosis not present

## 2016-03-19 MED ORDER — TRAMADOL HCL 50 MG PO TABS: 50.0000 mg | ORAL_TABLET | Freq: Three times a day (TID) | ORAL | 0 refills | Status: AC | PRN

## 2016-03-19 MED ORDER — PREDNISONE 20 MG PO TABS: ORAL_TABLET | ORAL | 0 refills | Status: AC

## 2016-03-19 NOTE — Progress Notes (Signed)
Patient ID: Robert Schroeder, male    DOB: July 25, 1985, 31 y.o.   MRN: [id number]  PCP: No primary care provider on file.  Chief Complaint  Patient presents with  . Ankle Injury    fell stairs at work did not file as WC / about 2 months ago     Subjective:  HPI Robert Schroeder is a 31 y.o. male presents for evaluation of right ankle swelling and pain x 2 months. He reports while ambulating on stairs while at work he tripped, falling in a position in which he sustained a significant impact directly his right ankle. After injury he was able to ambulate although he reports a great deal of swelling and pain. He did not reports incident at work. He was seen and evaluated at an urgent care in which imaging indicated negative for fracture, however recommended repeat imaging within 6 weeks.He reports consistency in taking ibuprofen, applying ice several  times per day with periods of feeling that his ankle was improving marked by worsening swelling and aching to throbbing pain. Constant pain 5/10 and pain can increase to 8/10 when pain becomes more sharp. Sharp pain localized to bilateral sides of right ankle and occasional radiates up his leg.  Intermittent pain at the right achilles tendon. He has been wearing ankle brace which he reports has increasingly become uncomfortable and bruises his skin. He is here today to obtain re-imaging of his right ankle.  Social History   Social History  . Marital status: Single    Spouse name: N/A  . Number of children: N/A  . Years of education: N/A   Occupational History  . Not on file.   Social History Main Topics  . Smoking status: Never Smoker  . Smokeless tobacco: Never Used  . Alcohol use Yes     Comment: occasionally  . Drug use: No  . Sexual activity: Not on file   Other Topics Concern  . Not on file   Social History Narrative  . No narrative on file    Family History  Problem Relation Age of Onset  . Thyroid disease Mother   .  Hypertension Mother   . Cancer Father     neyamiya psycoma  . Hyperlipidemia Father   . Anesthesia problems Father     states has a high tolerance to anesthesia  . Hypertension Maternal Grandmother   . Alcohol abuse Maternal Grandfather   . Heart disease Paternal Grandmother   . Heart disease Paternal Grandfather    Review of Systems See HPI Patient Active Problem List   Diagnosis Date Noted  . Postop check 03/19/2013  . Sebaceous cyst 02/18/2013  . Chest pain   . Headache(784.0)   . Syncope 10/06/2011    Allergies  Allergen Reactions  . Mushroom Extract Complex Shortness Of Breath and Swelling    Prior to Admission medications   Medication Sig Start Date End Date Taking? Authorizing Provider  ibuprofen (ADVIL,MOTRIN) 800 MG tablet Take 1 tablet (800 mg total) by mouth 3 (three) times daily. Patient not taking: Reported on 03/19/2016 03/21/13   Elpidio AnisShari Upstill, PA-C    Past Medical, Surgical Family and Social History reviewed and updated.    Objective:   Today's Vitals   03/19/16 1135  BP: 138/74  Pulse: 72  Resp: 16  Temp: 99.1 F (37.3 C)  Weight: 246 lb (111.6 kg)  Height: 6' 3.5" (1.918 m)    Wt Readings from Last 3 Encounters:  03/19/16 246 lb (  111.6 kg)  03/15/15 241 lb (109.3 kg)  03/09/15 244 lb 12.8 oz (111 kg)    Physical Exam  Constitutional: He is oriented to person, place, and time. He appears well-developed and well-nourished.  Cardiovascular: Normal rate, regular rhythm, normal heart sounds and intact distal pulses.   Musculoskeletal: He exhibits edema and tenderness.       Right ankle: He exhibits decreased range of motion and swelling. He exhibits no ecchymosis. Tenderness. Lateral malleolus and medial malleolus tenderness found. Achilles tendon exhibits pain.  Neurological: He is alert and oriented to person, place, and time. He has normal strength.  Reflex Scores:      Patellar reflexes are 2+ on the right side and 2+ on the left side.       Achilles reflexes are 2+ on the right side and 2+ on the left side.  Dg Ankle Complete Right  Result Date: 03/19/2016 CLINICAL DATA:  Right ankle pain and swelling after falling down stairs 2 months ago. EXAM: RIGHT ANKLE - COMPLETE 3+ VIEW COMPARISON:  None. FINDINGS: No fracture or dislocation. Joint spaces are preserved. Ankle mortise is preserved. No ankle joint effusion. Presumed dermal calcification overlies the anterior aspect the lower leg. Minimal enthesopathic change involving the talar beak. Suspected mild diffuse soft tissue swelling about the ankle. IMPRESSION: Mild diffuse soft tissue swelling about the ankle without associated fracture or dislocation. Electronically Signed   By: Simonne Come M.D.   On: 03/19/2016 12:16   Assessment & Plan:  1. Pain in joint involving right ankle and foot - DG Ankle Complete Right-Negative of fracture or dislocation    Plan: Take Prednisone 20 mg,  in mornings with breakfast as follows:  Take 3 pills for 3 days, Take 2 pills for 3 days, and Take 1 pill for 3 days.  Complete all medication.  For severe pain, Tramadol 50 mg every 8 hours as needed for moderate to severe pain.  Continue ice and warm soaks to promote healing.  If pain and swelling doesn't resolve with prednisone treatment, please call the office for a referral to orthopedics.   Godfrey Pick. Tiburcio Pea, MSN, FNP-C [facility] [phone]

## 2016-03-19 NOTE — Patient Instructions (Addendum)
Take Prednisone 20 mg,  in mornings with breakfast as follows:  Take 3 pills for 3 days, Take 2 pills for 3 days, and Take 1 pill for 3 days.  Complete all medication.  For severe pain, Tramadol 50 mg every 8 hours as needed for moderate to severe pain.  Continue ice and warm soaks to promote healing.  If pain and swelling doesn't resolve with prednisone treatment, please call the office for a referral to orthopedics.   IF you received an x-ray today, you will receive an invoice from [facility]. Please contact [facility] at [phone] with questions or concerns regarding your invoice.   IF you received labwork today, you will receive an invoice from [facility]. Please contact [facility] at [phone] with questions or concerns regarding your invoice.   Our billing staff will not be able to assist you with questions regarding bills from these companies.  You will be contacted with the lab results as soon as they are available. The fastest way to get your results is to activate your My Chart account. Instructions are located on the last page of this paperwork. If you have not heard from us regarding the results in 2 weeks, please contact this office.      Ankle Sprain An ankle sprain is a stretch or tear in one of the tough tissues (ligaments) in your ankle. Follow these instructions at home:  Rest your ankle.  Take over-the-counter and prescription medicines only as told by your doctor.  For 2-3 days, keep your ankle higher than the level of your heart (elevated) as much as possible.  If directed, put ice on the area:  Put ice in a plastic bag.  Place a towel between your skin and the bag.  Leave the ice on for 20 minutes, 2-3 times a day.  If you were given a brace:  Wear it as told.  Take it off to shower or bathe.  Try not to move your ankle much, but wiggle your toes from time to time. This helps to prevent swelling.  If you were given an elastic  bandage (dressing):  Take it off when you shower or bathe.  Try not to move your ankle much, but wiggle your toes from time to time. This helps to prevent swelling.  Adjust the bandage to make it more comfortable if it feels too tight.  Loosen the bandage if you lose feeling in your foot, your foot tingles, or your foot gets cold and blue.  If you have crutches, use them as told by your doctor. Continue to use them until you can walk without feeling pain in your ankle. Contact a doctor if:  Your bruises or swelling are quickly getting worse.  Your pain does not get better after you take medicine. Get help right away if:  You cannot feel your toes or foot.  Your toes or your foot looks blue.  You have very bad pain that gets worse. This information is not intended to replace advice given to you by your health care provider. Make sure you discuss any questions you have with your health care provider. Document Released: 06/08/2007 Document Revised: 05/28/2015 Document Reviewed: 07/22/2014 Elsevier Interactive Patient Education  2017 [employer]

## 2016-06-14 ENCOUNTER — Emergency Department
Admission: EM | Admit: 2016-06-14 | Discharge: 2016-06-14 | Disposition: A | Discharge status: Home / Self Care | Payer: Self-pay | Attending: Emergency Medical Services | Admitting: Emergency Medical Services

## 2016-06-14 DIAGNOSIS — F410 Panic disorder [episodic paroxysmal anxiety] without agoraphobia: Secondary | ICD-10-CM | POA: Insufficient documentation

## 2016-06-14 DIAGNOSIS — F17200 Nicotine dependence, unspecified, uncomplicated: Secondary | ICD-10-CM | POA: Insufficient documentation

## 2016-06-14 MED ORDER — ALPRAZOLAM 0.5 MG PO TABS
0.5000 mg | ORAL_TABLET | Freq: Three times a day (TID) | ORAL | Status: DC | PRN
Start: 2016-06-14 — End: 2016-06-22

## 2016-06-14 MED ORDER — ALPRAZOLAM 0.5 MG PO TABS
0.5000 mg | ORAL_TABLET | Freq: Once | ORAL | Status: AC
Start: 2016-06-14 — End: 2016-06-14
  Administered 2016-06-14: 03:00:00 0.5 mg via ORAL
  Filled 2016-06-14: qty 1

## 2016-06-14 NOTE — ED Triage Notes (Signed)
Patient had panic attack about 11pm and had EMS called but then did not come to the hospital to be checked out. Pt states he still feels anxious and some chest pain. Denies any SI or HI thoughts. States he did try to OD on pills last week as he has been feeling very depressed. Has not seen a doctor about anxiety/panic attacks and has not been on any medications.

## 2016-06-14 NOTE — ED Provider Notes (Signed)
Physician/Midlevel provider first contact with patient: 06/14/16 0211         History     Chief Complaint   Patient presents with   . Panic Attack     Patient presents with a panic attack.  States that over the past few days has been having some increased stresses at work and home.  Has recently moved into this area and an chest pain and appointment with a physician on Wednesday to be evaluated.  States last week he felt suicidal and try to overdose on some pain pills, but he does not feel suicidal anymore.  Anxiety attack was initially moderate now greatly improved, constant, made worse secondary to stresses in his life, and made better by relaxation, no associated pain.  Was associated with some chest tightness and hyperventilation. Denies f/c/n/v/d/c/chest pain/sob/headahce/photophobia/rash/focal neuro deficits/loc/neck pain/illicit drug use/suicidal or homicidal ideation currently        The history is provided by the patient. No language interpreter was used.            Past Medical History:   Diagnosis Date   . Anxiety        History reviewed. No pertinent surgical history.    No family history on file.    Social  Social History   Substance Use Topics   . Smoking status: Light Tobacco Smoker   . Smokeless tobacco: Never Used   . Alcohol use No      Comment: socially        .     No Known Allergies    Home Medications     Med List Status:  In Progress Set By: Evangeline Gula, RN at 06/14/2016  1:54 AM        No Medications           Review of Systems   Constitutional: Negative for chills and fever.   HENT: Negative for rhinorrhea and sore throat.    Eyes: Negative for photophobia and discharge.   Respiratory: Positive for chest tightness. Negative for cough and shortness of breath.    Cardiovascular: Negative for chest pain and palpitations.   Gastrointestinal: Negative for abdominal pain, diarrhea, nausea and vomiting.   Genitourinary: Negative for dysuria, frequency and hematuria.   Musculoskeletal:  Negative for back pain, myalgias and neck pain.   Neurological: Negative for dizziness, syncope, weakness, light-headedness, numbness and headaches.   Psychiatric/Behavioral: Negative for confusion and suicidal ideas. The patient is nervous/anxious.        Physical Exam    BP: 117/60, Heart Rate: 62, Temp: 97.7 F (36.5 C), Resp Rate: 16, SpO2: 100 %, Weight: 108.9 kg    Physical Exam   Constitutional: He is oriented to person, place, and time. He appears well-developed and well-nourished.  Non-toxic appearance. No distress.   HENT:   Head: Normocephalic and atraumatic.   Mouth/Throat: Oropharynx is clear and moist. No oropharyngeal exudate.   Eyes: Conjunctivae, EOM and lids are normal. Pupils are equal, round, and reactive to light. Right eye exhibits no discharge and no exudate. Left eye exhibits no discharge and no exudate. Right conjunctiva is not injected. Left conjunctiva is not injected.   Neck: Normal range of motion and full passive range of motion without pain. Neck supple. No JVD present. No tracheal tenderness, no spinous process tenderness and no muscular tenderness present. Carotid bruit is not present. Normal range of motion present.   Cardiovascular: Normal rate, regular rhythm, normal heart sounds, intact distal pulses and normal  pulses.    Pulmonary/Chest: Effort normal and breath sounds normal. No stridor. No respiratory distress. He has no decreased breath sounds. He has no wheezes. He has no rales. He exhibits no tenderness.   Abdominal: Soft. Bowel sounds are normal. He exhibits no distension and no mass. There is no tenderness. There is no rebound and no guarding.   Musculoskeletal: Normal range of motion. He exhibits no edema or tenderness.   No calf tenderness, no Homman's sign   Neurological: He is alert and oriented to person, place, and time. He has normal strength and normal reflexes. No cranial nerve deficit or sensory deficit. Coordination normal. GCS eye subscore is 4. GCS verbal  subscore is 5. GCS motor subscore is 6.   Reflex Scores:       Patellar reflexes are 2+ on the right side and 2+ on the left side.  Skin: Skin is warm. No rash noted. He is not diaphoretic. No pallor.   Psychiatric: He has a normal mood and affect. His speech is normal and behavior is normal. Judgment and thought content normal. Cognition and memory are normal.   Nursing note and vitals reviewed.        MDM and ED Course     ED Medication Orders     Start Ordered     Status Ordering Provider    06/14/16 0228 06/14/16 0227  ALPRAZolam Prudy Feeler) tablet 0.5 mg  Once     Route: Oral  Ordered Dose: 0.5 mg     Last MAR action:  Given Severn Goddard             MDM  Number of Diagnoses or Management Options  Panic attack:   Diagnosis management comments: I, Reine Just, have assumed care of Abrahm Z Dorton.  I have completed his evaluation, reviewed all pertinent data, and determined his final disposition.    " *This note was generated by the Epic EMR system/ Dragon speech recognition and may contain inherent errors or omissions not intended by the user. Grammatical errors, random word insertions, deletions, pronoun errors and incomplete sentences are occasional consequences of this technology due to software limitations. Not all errors are caught or corrected. If there are questions or concerns about the content of this note or information contained within the body of this dictation they should be addressed directly with the author for clarification."      Oxygen saturation by pulse oximetry is 95%-100%, Normal.  Interventions: None Needed.     I reviewed nursing recorded vitals and history including PMSFHX         Patient with panic attack that is almost fully resolved now.  Not suicidal or homicidal.  We will discharge home with one Xanax pill and give prescription for only 3 pills.  He is to follow-up with Dr. Nedra Hai on Wednesday for reevaluation.  He understands to return to ED for any acute changes or concerns.                    Procedures    Clinical Impression & Disposition     Clinical Impression  Final diagnoses:   Panic attack        ED Disposition     ED Disposition Condition Date/Time Comment    Discharge  Tue Jun 14, 2016  2:26 AM Lajean Silvius discharge to home/self care.    Condition at disposition: Stable           Discharge Medication List as of  06/14/2016  2:27 AM      START taking these medications    Details   ALPRAZolam (XANAX) 0.5 MG tablet Take 1 tablet (0.5 mg total) by mouth 3 (three) times daily as needed for Sleep or Anxiety., Starting Tue 06/14/2016, Print                       Reine Just, MD  06/14/16 [phone]

## 2016-06-14 NOTE — Discharge Instructions (Signed)
f/u with Dr. Nedra Hai on Wednesday as prior set up for re-eval. return to ed if worsen or not better or have any acute concerns.       Anxiety, Panic    You have been diagnosed with an anxiety attack.    You seem to have had an anxiety attack. There are many conditions that can cause symptoms like these. If this is the first time this has happened, follow-up with your regular doctor. You may need more testing to be sure there isn't another cause for your symptoms.    Anxiety causes very strong feelings of worry and fear. It may also cause chest pain or shortness of breath. You may feel like you have palpitations (a racing heart). You might feel numbness (like parts of your body are "asleep"), especially around the mouth and in the hands or feet.    Follow up with your counselor and family doctor. If you do not have an appointment in the next 2-3 days, call and make one. It is VERY IMPORTANT for your counselor and family doctor to know if you get worse.    YOU SHOULD SEEK MEDICAL ATTENTION IMMEDIATELY, EITHER HERE OR AT THE NEAREST EMERGENCY DEPARTMENT, IF ANY OF THE FOLLOWING OCCURS:   You have symptoms that you normally don't have with your anxiety attacks.   You think of harming yourself (suicidal thoughts) or harming someone else.   You have symptoms you normally don't have and they last longer than normal or your medicine doesn't help. These include chest pain, passing out, feeling that your heart is racing or shortness of breath.   You have a fever (temperature higher than 100.51F / 38C).

## 2016-06-22 ENCOUNTER — Ambulatory Visit (INDEPENDENT_AMBULATORY_CARE_PROVIDER_SITE_OTHER): Payer: PRIVATE HEALTH INSURANCE | Admitting: Cardiovascular Disease

## 2016-06-22 ENCOUNTER — Emergency Department
Admission: EM | Admit: 2016-06-22 | Discharge: 2016-06-22 | Disposition: A | Discharge status: Home / Self Care | Payer: PRIVATE HEALTH INSURANCE | Attending: Emergency Medicine | Admitting: Emergency Medicine

## 2016-06-22 ENCOUNTER — Encounter (INDEPENDENT_AMBULATORY_CARE_PROVIDER_SITE_OTHER): Payer: Self-pay

## 2016-06-22 ENCOUNTER — Telehealth (HOSPITAL_BASED_OUTPATIENT_CLINIC_OR_DEPARTMENT_OTHER): Payer: Self-pay

## 2016-06-22 ENCOUNTER — Telehealth (INDEPENDENT_AMBULATORY_CARE_PROVIDER_SITE_OTHER): Payer: Self-pay

## 2016-06-22 DIAGNOSIS — F419 Anxiety disorder, unspecified: Secondary | ICD-10-CM | POA: Insufficient documentation

## 2016-06-22 DIAGNOSIS — F17200 Nicotine dependence, unspecified, uncomplicated: Secondary | ICD-10-CM | POA: Insufficient documentation

## 2016-06-22 DIAGNOSIS — F320 Major depressive disorder, single episode, mild: Secondary | ICD-10-CM

## 2016-06-22 DIAGNOSIS — F32A Depression, unspecified: Secondary | ICD-10-CM

## 2016-06-22 DIAGNOSIS — F322 Major depressive disorder, single episode, severe without psychotic features: Secondary | ICD-10-CM

## 2016-06-22 DIAGNOSIS — F329 Major depressive disorder, single episode, unspecified: Secondary | ICD-10-CM | POA: Insufficient documentation

## 2016-06-22 LAB — COMPREHENSIVE METABOLIC PANEL
ALT: 18 U/L (ref 0–55)
AST (SGOT): 20 U/L (ref 5–34)
Albumin/Globulin Ratio: 1.2 (ref 0.9–2.2)
Albumin: 4.4 g/dL (ref 3.5–5.0)
Alkaline Phosphatase: 63 U/L (ref 38–106)
Anion Gap: 11 (ref 5.0–15.0)
BUN: 12 mg/dL (ref 9.0–28.0)
Bilirubin, Total: 0.7 mg/dL (ref 0.2–1.2)
CO2: 30 mEq/L — ABNORMAL HIGH (ref 22–29)
Calcium: 9.8 mg/dL (ref 8.5–10.5)
Chloride: 99 mEq/L — ABNORMAL LOW (ref 100–111)
Creatinine: 1.4 mg/dL — ABNORMAL HIGH (ref 0.7–1.3)
Globulin: 3.6 g/dL (ref 2.0–3.6)
Glucose: 101 mg/dL — ABNORMAL HIGH (ref 70–100)
Potassium: 3.9 mEq/L (ref 3.5–5.1)
Protein, Total: 8 g/dL (ref 6.0–8.3)
Sodium: 140 mEq/L (ref 136–145)

## 2016-06-22 LAB — URINALYSIS
Bilirubin, UA: NEGATIVE
Blood, UA: NEGATIVE
Glucose, UA: NEGATIVE
Leukocyte Esterase, UA: NEGATIVE
Nitrite, UA: NEGATIVE
Protein, UR: NEGATIVE
Specific Gravity UA: 1.028 (ref 1.001–1.035)
Urine pH: 6 (ref 5.0–8.0)
Urobilinogen, UA: 0.2 mg/dL

## 2016-06-22 LAB — CBC AND DIFFERENTIAL
Absolute NRBC: 0 10*3/uL
Basophils Absolute Automated: 0.02 10*3/uL (ref 0.00–0.20)
Basophils Automated: 0.3 %
Eosinophils Absolute Automated: 0.05 10*3/uL (ref 0.00–0.70)
Eosinophils Automated: 0.7 %
Hematocrit: 44.1 % (ref 42.0–52.0)
Hgb: 14.6 g/dL (ref 13.0–17.0)
Immature Granulocytes Absolute: 0.01 10*3/uL
Immature Granulocytes: 0.1 %
Lymphocytes Absolute Automated: 4.32 10*3/uL (ref 0.50–4.40)
Lymphocytes Automated: 56.5 %
MCH: 28.7 pg (ref 28.0–32.0)
MCHC: 33.1 g/dL (ref 32.0–36.0)
MCV: 86.6 fL (ref 80.0–100.0)
MPV: 11.2 fL (ref 9.4–12.3)
Monocytes Absolute Automated: 0.49 10*3/uL (ref 0.00–1.20)
Monocytes: 6.4 %
Neutrophils Absolute: 2.76 10*3/uL (ref 1.80–8.10)
Neutrophils: 36 %
Nucleated RBC: 0 /100 WBC (ref 0.0–1.0)
Platelets: 202 10*3/uL (ref 140–400)
RBC: 5.09 10*6/uL (ref 4.70–6.00)
RDW: 12 % (ref 12–15)
WBC: 7.65 10*3/uL (ref 3.50–10.80)

## 2016-06-22 LAB — RAPID DRUG SCREEN, URINE
Barbiturate Screen, UR: NEGATIVE
Benzodiazepine Screen, UR: NEGATIVE
Cannabinoid Screen, UR: POSITIVE — AB
Cocaine, UR: NEGATIVE
Opiate Screen, UR: NEGATIVE
PCP Screen, UR: NEGATIVE
Urine Amphetamine Screen: NEGATIVE

## 2016-06-22 LAB — TSH: TSH: 0.9 u[IU]/mL (ref 0.35–4.94)

## 2016-06-22 LAB — ACETAMINOPHEN LEVEL: Acetaminophen Level: <7 ug/mL — ABNORMAL LOW (ref 10–30)

## 2016-06-22 LAB — SALICYLATE LEVEL: Salicylate Level: <5 mg/dL — ABNORMAL LOW (ref 15.0–30.0)

## 2016-06-22 LAB — GFR: EGFR: >60

## 2016-06-22 LAB — MAGNESIUM: Magnesium: 2.3 mg/dL (ref 1.6–2.6)

## 2016-06-22 LAB — ETHANOL: Alcohol: NOT DETECTED mg/dL

## 2016-06-22 LAB — PHOSPHORUS: Phosphorus: 4.2 mg/dL (ref 2.3–4.7)

## 2016-06-22 MED ORDER — TETANUS-DIPHTH-ACELL PERTUSSIS 5-2.5-18.5 LF-MCG/0.5 IM SUSP
0.5000 mL | Freq: Once | INTRAMUSCULAR | Status: AC
Start: 2016-06-22 — End: 2016-06-22
  Administered 2016-06-22: 17:00:00 0.5 mL via INTRAMUSCULAR
  Filled 2016-06-22: qty 0.5

## 2016-06-22 MED ORDER — LORAZEPAM 1 MG PO TABS
1.0000 mg | ORAL_TABLET | Freq: Once | ORAL | Status: AC
Start: 2016-06-22 — End: 2016-06-22
  Administered 2016-06-22: 20:00:00 1 mg via ORAL
  Filled 2016-06-22: qty 1

## 2016-06-22 NOTE — Progress Notes (Signed)
..Psychiatric Evaluation Part I    Cesar Hicks is a 31 y.o. male admitted to the [facility] Emergency Department who was seen via Telepsych on 06/22/2016 by Clifton Custard, MSW.    Call Details  Patient Location: [facility] ED  Patient Room Number: M07  Time contacted by ED Physician: 1744  Time consult began: 1835  Time (in minutes) from Call to Consult: 51  Time consult concluded: 1921  Referring ED Department  Emergency Department: [facility]        Discharge Planning  Living Arrangements: Alone  Support Systems: Spouse/significant other  Type of Residence: Private residence  Patient expects to be discharged to:: home with follow up at [facility] tomorrow    Presenting Mental Status  Orientation Level: Oriented X4  Memory: No Impairment  Thought Content: normal  Thought Process: normal  Behavior: normal  Consciousness: Alert  Impulse Control: normal  Perception: normal  Eye Contact: normal  Attitude: cooperative  Mood: depressed, anxious  Hopelessness Affects Goals: No  Hopelessness About Future: No  Affect: normal  Speech: normal  Concentration: impaired  Insight: fair  Judgment: fair  Appearance: normal  Appetite: decreased  Weight change?:  (weight has been fluctuating)  Energy: decreased  Sleep:  (difficulty falling and staying asleep)  Reliability of Reporter/Patient: fair    Tool for Assessment of Suicide Risk  Individual Risk Profile: male, age 61-30, psychiatric illness  Symptom Risk Profile: depressive symptoms, anxiety  Interview Risk Profile: recent substance abuse, suicidal ideation (SI a few month ago but not at this time; no history of attempts; no inpatient treatment)  Protective Factors: safety plan, supportive significant other, employed  Level of Suicide Risk: Low    Within the Last 6 Months:: no history of violence toward self  Greater than 6 Months Ago:: no history of violence toward self                            Preliminary Diagnosis #1: Depression  Preliminary Diagnosis #2: Anxiety         Violence Toward Others  Within the Last 6 Months:: no history of violence toward others  Greater than 6 Months Ago:: no history of violence toward others     Preliminary Diagnosis (DSM IV)  Axis I: Depression  Axis II: Defer  Axis III: None  Axis IV: Occupational, Other (comment)        Summary: Pt is a 31 year old male who brought himself to the ED after seeing a new primary care physician today. Pt told the primary care physician about his anxiety, panic attacks and depression and pt was referred for an evaluation. Pt has suffered with anxiety and depression since middle school but was not always forthcoming about being depressed.  Pt has never been seen by a psychiatrist, had counseling nor has he ever required inpatient psychiatric treatment. Pt reports 1 prior suicide attempt whereby he took old prescription medication that he had from a surgery 2 years ago.  Pt reports having had some SI a couple of months ago but not currently. 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 That initial contract ended and pt is now on another contract; however, the pay is lower. Overall, pt feels a lack of stability and feels out of place here.   Pt's family is primarily in [place], with the exception of an aunt who lives here.  Pt  does have a girlfriend who lives here and she is very supportive.  Pt's appetite is decreased and he is eating about every other day. Pt has gone as much as 2-3 days with no food.  Pt's sleep is fluctuating and he  Is having trouble falling and staying asleep.   Pt's health insurance recently became active so pt is now seeking treatment.  Disposition:  Pt is safe for discharge home and is appropriate to come to [facility] tomorrow to be seen by a clinician there.  Pt wants to consider medication.    Justification for disposition: Pt not suicdial, homicidal nor is he experiencing any symptoms of psychosis. Pt is safe for discharge and will follow up at [facility] tomorrow. Pt also urged to find a therapist.      If patient is voluntarily admitted to an [facility] inpatient psychiatric unit and decides to leave AMA within the first 8 hours on the unit, is there an identified petitioner?No    Name of Petitioner: N/A  Contact Information:N/a    Insurance Pre-authorization information:n/a    Was consent for voluntary admission obtain and scanned  into EPIC? n/A     By whom? n/a      Clifton Custard, MSW    [facility]  [address]  [place], [place] [address]  [phone]

## 2016-06-22 NOTE — Telephone Encounter (Addendum)
Mother calling from [place] in reference to not seeing the patient (her son)- I explained to her that Dr. Nedra Hai stated he needs to go to the ER if he has any suicidal ideations- that any medication that she would prescribe would not take effect right away. I explained to mother that patient has appointment on 06/24/2016.

## 2016-06-22 NOTE — ED Triage Notes (Signed)
Pt reports increasing anxiety and depression for 2 months. Pt has several shallow self-cutting marks on left arm. Pt reports he has not done this since grade school

## 2016-06-22 NOTE — Discharge Instructions (Signed)
Depression    After evaluation, your doctor feels that you are suffering from depression.    Depression is an illness that can make you feel guilty or worthless. It can make it hard to concentrate or make you lose interest in things you usually enjoy. It may also affect your ability to eat, sleep, and function like normal. Depression is caused by a chemical imbalance in the brain that affects mood. Depression can be treated!   The diagnosis of depression is given when these symptoms are present every day for at least 2 weeks.    It is important to follow up with a counselor as well as your family doctor. If you do not have an appointment in the next 2 to 3 days, call and make one. It is VERY IMPORTANT that your counselor and family doctor know that your problems are getting worse.    It is important to have a counselor and a family doctor who see you on a regular basis. They can help you with your problem, keep a close eye on you and follow your progress. You have been given a list of phone numbers to call if you feel that you need to talk to someone before your regular appointment.    Drinking alcohol can make your depression worse. Do not drink alcohol while taking antidepressant medication.    YOU SHOULD SEEK MEDICAL ATTENTION IMMEDIATELY, EITHER HERE OR AT THE NEAREST EMERGENCY DEPARTMENT, IF ANY OF THE FOLLOWING OCCURS:   You do not feel safe in your home environment.   You think of harming yourself or suicide.   You think of harming others.   You become worse and feel that you cannot wait until your follow-up appointment for treatment.               Generalized Anxiety Disorder (GAD)    You were seen for Generalized Anxiety Disorder (GAD).    Generalized Anxiety Disorder happens when someone worries too much about daily life without having a clear reason why. The anxiety can be caused by normal, everyday things even when there is little or no cause for worry. The person can be very anxious about  just getting through the day.     GAD often starts when people are teens or young adults. Sometimes this problem is hard to diagnose because people with GAD may not have specific complaints when they see the doctor. This can make it hard to figure out exactly what is going on and to make the right diagnosis.     Vague complaints can include:     Problems focusing.   Feeling tired.   Having trouble sleeping or feeling restless.   Being startled easily (jumpy).   Feeling worried all the time.    Often people worry so much that they can't have a normal relationship, do their daily activities or do well at work. They often worry all day long. This often happens when people are under a lot of stress. Generalized Anxiety is different from Panic Attacks. Usually, panic attacks start suddenly and then go away fairly quickly. In between panic attacks, the person can feel normal.    Most of the time, a psychiatrist or primary care doctor can treat Generalized Anxiety Disorder. The doctor you saw today feels this is the best plan for you.    Sometimes having anxiety can lead to serious problems. Some people feel very depressed or like hurting themselves. We don't believe your condition is dangerous right  now. However, you need to be careful. Sometimes a problem that seems small can get serious later.     Some things that can be tried are:     Anxiety support groups.   Antidepressant medications.   Individual therapy.    YOU SHOULD SEEK MEDICAL ATTENTION IMMEDIATELY, EITHER HERE OR AT THE NEAREST EMERGENCY DEPARTMENT, IF ANY OF THE FOLLOWING OCCUR:     You feel like hurting yourself or someone else.   You notice your heart is racing and can't explain why.    If you develop chest pain.   You are abusing alcohol or any other drugs.   You have trouble with your follow-up or have any other concerns.    The number for the Suicide Prevention Hotline is 1-800-SUICIDE 343-854-7819) or 1-800-273-TALK  (8255).    If you can't follow up with your doctor, or if at any time you feel you need to be rechecked or seen again, come back here or go to the nearest emergency department.             [facility]  [facility] supports [facility]'s overall mission to promote total wellness- mind and body- by offering a full spectrum of mental health and addiction treatment services to the [place] metropolitan area.      DESCRIPTION OF SERVICES  [facility]. [facility] provides a unique and valuable resource to our community by offering an urgent psychiatric assessment. Typically the wait to see an outpatient psychiatrist in the [place] area is at least a 1-2 months, and a majority of those do not accept insurance. At [facility], a patient can schedule or walk in to have an urgent appointment (preferred to decrease wait time) with a psychiatrist from [facility]- 8PM every day. [facility] clinicians can be accessed by phone 24/7 [phone]). Most insurances are accepted and self pay options are available as well. [facility] can refer and place clients into one of our mental health or addiction programs depending on their treatment needs.    [facility]  [address]  [place], [place] [address]  [phone]    Inpatient Services Our Inpatient Psychiatric Units are located within the [place], [facility], and [employer]. Our renowned [facility] inpatient medical detoxification unit is located within the [employer]. Our inpatient units provide our patients with safety, structure, and stabilization via medication management and therapeutic group activities held throughout the day. Our multi-disciplinary team includes physicians, nurses, therapists, and case managers.    Day Treatment/Partial Hospitalization Program (PHP). Brief, supportive and structured crisis intervention with supportive, short term and solution  focused group therapy. Each PHP consists of daily therapeutic and educational groups with either a mental health or addiction recovery emphasis. Medication evaluation and management are provided by a physician as needed. Locations at our [place], [place], and [place] outpatient centers. The overall length of stay varies on a case-by-case basis but is usually one to two weeks.    Intensive Outpatient Program (IOP). Intensive recovery group services structured to address the needs of those in early recovery from chemical dependence. Each IOP meets three times a week, three hours at a time, for a total of ten weeks. Day and Evening time are offered. Locations are at [facility], [facility] and [place] outpatient centers. A Dual Diagnosis IOP is also available for those experiencing a co-occurring chemical dependence and psychiatric diagnosis.    Outpatient  Medication Management Services Psychiatric evaluation and medication management performed by a physician or nurse practitioner on an outpatient basis. This includes our [facility] that provides long acting Zyprexa, Haldol, and Risperdal administered intramuscularly by a registered nurse. To assist in recovery, we also offer Medication Assisted Therapy which consists of Suboxone and Vivitrol induction and management prescribed and supervised by a physician and administered by a registered nurse. Hepatitis B vaccine also available. Services available at our [place], [place], and [place] locations.    Counseling Services offering individual and family psychotherapy are available. Bariatric counseling is given in conjunction with [facility] renowned [facility] team. For addiction recovery, we offer family awareness groups, sober living and relapse prevention groups. Irena Reichmann offers adolescents Dialectical Behavioral Therapy (DBT) groups, skill groups and psychotherapy groups.      SERVICE LOCATIONS    [facility]  [address]  [place], [place] [address]  . Psychiatric or Addiction Crisis Evaluation  . By appointment (preferred) or by appointment  . [facility] 24/7 [phone]    [facility]  [address]  [place], [place] [address]  . Inpatient Psychiatry  . [facility] inpatient medical detoxification  . [facility] day treatment (PHP)    [facility]  [address]  [place], [place] [address]  . Mental Health Day Treatment (PHP)  . [facility] Intensive Outpatient Program (IOP)  . Medication Management Services  . Counseling Services- Individual     [facility]  [address]  [place], [place] [address]  [phone]  . Mental Health Day Treatment (PHP)  . Mental Health Intensive Outpatient Program (IOP)  . Medication Management Services  . Counseling Services- Autism Spectrum, ADD and educational testing, Individual and Family Psychotherapy,     DBT groups  . Therapeutic Day School    [facility]  [address]  [place], [place] [address]  . Inpatient Psychiatry    [facility]  [address]  [address]  [place], [place] [address]  . Mental Health Day Treatment (PHP)  . [facility] Intensive Outpatient Program (IOP)  . Medication Management Services  . Counseling Services- Individual Psychotherapy    [facility]  [address] [place], [place] [address]  . Inpatient Psychiatry    [facility]  [address] [place], [place] [address]  . Mental Health Day Treatment (PHP)  . [facility] Intensive Outpatient Program (IOP)  . Medication Management Services    [facility]  [address]  [place], [place] [address]  . Bariatric Counseling      ADMISSIONS    Adults: [phone]  Adolescents: [phone]

## 2016-06-22 NOTE — Telephone Encounter (Signed)
Called patient and asked the questions from triage book page [phone]-    1) Are any of the following present?   Severe respitory distress, chest pain, or abdominal pain?  Suicide attempts- such as physical injury or overdose?  Suicidal attempt in progress?  threaten to harm self or other?  Suicidal thoughts with specific plan?  New onset of confusion or delusional thinking?  Patient states suicidal thoughts several weeks ago not now    2) Are any of the following present?  Refusal to talk to anyone, history of prior suicide attempts, depression, intoxication, suicidal thoughts but no plan or injury, recent medication change? Patient stated Depression.    This Clinical research associate asked patient to come back in to be seen in walk in and patient stated he is no longer coming back- this Clinical research associate asked if he was keeping his Friday appointment? Patient stated no. This Clinical research associate again asked him to come in to walk in and patient hung up on this Clinical research associate.

## 2016-06-22 NOTE — ED Provider Notes (Signed)
Physician/Midlevel provider first contact with patient: 06/22/16 1644         History     Chief Complaint   Patient presents with   . Depression     Pt presents to ER for psychiatric eval.  Pt states he recently moved to the area.  He states he has been having anxiety and depression for several months.  He has had suicidal thoughts but not recently.  He does have multiple superficial abrasions to left forearm the are self inflicted to relieve anxiety per pt.  Pt states he was seen in the ER for same 8 days ago.  He has tried to get help as an out patient but has been having trouble finding someone that will see him.        The history is provided by the patient. No language interpreter was used.   Depression   This is a new problem. The current episode started more than 1 month ago. The problem occurs constantly. The problem has been gradually worsening. Pertinent negatives include no abdominal pain, anorexia, chest pain, chills, congestion, coughing, fever, headaches, myalgias, nausea, neck pain, numbness, rash, urinary symptoms, vomiting or weakness. Nothing aggravates the symptoms. He has tried nothing for the symptoms.            Past Medical History:   Diagnosis Date   . Anxiety        History reviewed. No pertinent surgical history.    History reviewed. No pertinent family history.    Social  Social History   Substance Use Topics   . Smoking status: Light Tobacco Smoker   . Smokeless tobacco: Never Used   . Alcohol use No      Comment: socially        .     No Known Allergies    Home Medications                             Review of Systems   Constitutional: Negative for chills and fever.   HENT: Negative for congestion.    Respiratory: Negative for cough and shortness of breath.    Cardiovascular: Negative for chest pain and palpitations.   Gastrointestinal: Negative for abdominal pain, anorexia, nausea and vomiting.   Genitourinary: Negative for dysuria and hematuria.   Musculoskeletal: Negative for  myalgias and neck pain.   Skin: Negative for color change, pallor, rash and wound.   Neurological: Negative for weakness, numbness and headaches.   Hematological: Negative for adenopathy. Does not bruise/bleed easily.   Psychiatric/Behavioral: Positive for suicidal ideas. Negative for agitation and confusion.       Physical Exam    BP: (!) 154/95, Heart Rate: 63, Temp: 98.5 F (36.9 C), Resp Rate: 18, SpO2: 100 %, Weight: 108.9 kg    Physical Exam   Constitutional: He is oriented to person, place, and time. He appears well-developed and well-nourished. No distress.   HENT:   Head: Normocephalic and atraumatic.   Right Ear: External ear normal.   Left Ear: External ear normal.   Nose: Nose normal.   Mouth/Throat: Oropharynx is clear and moist.   Eyes: Conjunctivae are normal. Pupils are equal, round, and reactive to light. Right eye exhibits no discharge. Left eye exhibits no discharge. No scleral icterus.   Neck: Normal range of motion. Neck supple.   Cardiovascular: Normal rate, regular rhythm, normal heart sounds and intact distal pulses.  Exam reveals no gallop and  no friction rub.    No murmur heard.  Pulmonary/Chest: Effort normal and breath sounds normal. No respiratory distress. He has no wheezes. He has no rales. He exhibits no tenderness.   Abdominal: Soft. Bowel sounds are normal. He exhibits no distension and no mass. There is no tenderness. There is no rebound and no guarding. No hernia.   Musculoskeletal: Normal range of motion.        Arms:  Neurological: He is alert and oriented to person, place, and time.   Skin: Skin is warm and dry. Capillary refill takes less than 2 seconds. He is not diaphoretic.   Psychiatric: His speech is normal and behavior is normal. Judgment and thought content normal. His mood appears anxious. Cognition and memory are normal. He exhibits a depressed mood.   Nursing note and vitals reviewed.        MDM and ED Course     ED Medication Orders     None             MDM  Number  of Diagnoses or Management Options  Anxiety:   Depression, unspecified depression type:   Diagnosis management comments: Cesar Hicks , PA-C, have been the primary provider for Cesar Hicks during this Emergency Dept visit. The attending signature signifies review and agreement of the history, physical exam, evaluation, clinical impression and plan except as noted.   I have reviewed the nursing notes, including Past medical and surgical,Family and Social History.       EKG Interpretation    EKG interpreted by:Dr Zoeller  Time: 1700  Rate: Bradycardic  Rhythm: sinus bradycardia  Axis: Normal  ST Segments: Normal ST segments  T waves: Normal T Waves  Conduction: Incomplete RBBB  Impression:  Nonspecific EKG    Pt evaluated by central access and he is clear to go home.  Pt to go to [facility] tomorrow and also call [facility] at [phone].    Discussed with patient need for follow-up. Return to the ER for any concerns. Pt voices understanding. No questions.          Amount and/or Complexity of Data Reviewed  Clinical lab tests: ordered and reviewed  Discuss the patient with other providers: yes (Discussed with Dr Deliah Boston)    Risk of Complications, Morbidity, and/or Mortality  Presenting problems: moderate  Diagnostic procedures: moderate  Management options: moderate    Patient Progress  Patient progress: stable             Results     Procedure Component Value Units Date/Time    TSH [[id number]] Collected:  06/22/16 1702    Specimen:  Blood Updated:  06/22/16 1822     TSH 0.90 uIU/mL     Acetaminophen level [[id number]]  (Abnormal) Collected:  06/22/16 1702    Specimen:  Blood Updated:  06/22/16 1753     Acetaminophen Level <7 (L) ug/mL     ASA  level [[id number]]  (Abnormal) Collected:  06/22/16 1702    Specimen:  Blood Updated:  06/22/16 1753     Salicylate Level <5.0 (L) mg/dL     GFR [[id number]] Collected:  06/22/16 1702     Updated:  06/22/16 1753     EGFR >60.0    Comprehensive metabolic panel (CMP)  [[id number]]  (Abnormal) Collected:  06/22/16 1702    Specimen:  Blood Updated:  06/22/16 1753     Glucose 101 (H) mg/dL      BUN 82.9 mg/dL  Creatinine 1.4 (H) mg/dL      Sodium 161 mEq/L      Potassium 3.9 mEq/L      Chloride 99 (L) mEq/L      CO2 30 (H) mEq/L      Calcium 9.8 mg/dL      Protein, Total 8.0 g/dL      Albumin 4.4 g/dL      AST (SGOT) 20 U/L      ALT 18 U/L      Alkaline Phosphatase 63 U/L      Bilirubin, Total 0.7 mg/dL      Globulin 3.6 g/dL      Albumin/Globulin Ratio 1.2     Anion Gap 11.0    Magnesium [[id number]] Collected:  06/22/16 1702    Specimen:  Blood Updated:  06/22/16 1753     Magnesium 2.3 mg/dL     Phosphorus [[id number]] Collected:  06/22/16 1702    Specimen:  Blood Updated:  06/22/16 1753     Phosphorus 4.2 mg/dL     Alcohol (Ethanol)  Level [[id number]] Collected:  06/22/16 1702    Specimen:  Blood Updated:  06/22/16 1753     Alcohol None Detected mg/dL     Urine Rapid Drug Screen [[id number]]  (Abnormal) Collected:  06/22/16 1700    Specimen:  Urine Updated:  06/22/16 1753     Amphetamine Screen, UR Negative     Barbiturate Screen, UR Negative     Benzodiazepine Screen, UR Negative     Cannabinoid Screen, UR Positive (A)     Cocaine, UR Negative     Opiate Screen, UR Negative     PCP Screen, UR Negative    UA with reflex to micro (all freestanding ED's except [facility], pts 3 + yrs) [[id number]] Collected:  06/22/16 1700    Specimen:  Urine Updated:  06/22/16 1734     Urine Type Clean Catch     Color, UA YELLOW     Clarity, UA CLEAR     Specific Gravity UA 1.028     Urine pH 6.0     Leukocyte Esterase, UA NEGATIVE     Nitrite, UA NEGATIVE     Protein, UR NEGATIVE     Glucose, UA NEGATIVE     Ketones UA TRACE     Urobilinogen, UA 0.2 mg/dL      Bilirubin, UA NEGATIVE     Blood, UA NEGATIVE    CBC with differential [[id number]] Collected:  06/22/16 1702    Specimen:  Blood from Blood Updated:  06/22/16 1732     WBC 7.65 x10 3/uL      Hgb 14.6 g/dL      Hematocrit 64.4 %       Platelets 202 x10 3/uL      RBC 5.09 x10 6/uL      MCV 86.6 fL      MCH 28.7 pg      MCHC 33.1 g/dL      RDW 12 %      MPV 11.2 fL      Neutrophils 36.0 %      Lymphocytes Automated 56.5 %      Monocytes 6.4 %      Eosinophils Automated 0.7 %      Basophils Automated 0.3 %      Immature Granulocyte 0.1 %      Nucleated RBC 0.0 /100 WBC      Neutrophils Absolute 2.76 x10 3/uL      Abs Lymph  Automated 4.32 x10 3/uL      Abs Mono Automated 0.49 x10 3/uL      Abs Eos Automated 0.05 x10 3/uL      Absolute Baso Automated 0.02 x10 3/uL      Absolute Immature Granulocyte 0.01 x10 3/uL      Absolute NRBC 0.00 x10 3/uL               Procedures    Clinical Impression & Disposition     Clinical Impression  Final diagnoses:   Depression, unspecified depression type   Anxiety        ED Disposition     ED Disposition Condition Date/Time Comment    Discharge  Wed Jun 22, 2016  7:50 PM Cesar Hicks discharge to home/self care.    Condition at disposition: Stable           There are no discharge medications for this patient.                Ulla Gallo, [place]  06/22/16 2023       Derrill Kay, MD  06/24/16 [phone]

## 2016-06-23 LAB — ECG 12-LEAD
Atrial Rate: 59 {beats}/min
P Axis: 73 degrees
P-R Interval: 152 ms
Q-T Interval: 426 ms
QRS Duration: 108 ms
QTC Calculation (Bezet): 421 ms
R Axis: 25 degrees
T Axis: 34 degrees
Ventricular Rate: 59 {beats}/min

## 2016-06-25 ENCOUNTER — Telehealth (HOSPITAL_BASED_OUTPATIENT_CLINIC_OR_DEPARTMENT_OTHER): Payer: Self-pay | Admitting: Addiction (Substance Use Disorder)

## 2016-06-25 ENCOUNTER — Encounter (HOSPITAL_BASED_OUTPATIENT_CLINIC_OR_DEPARTMENT_OTHER): Payer: Self-pay

## 2016-06-25 ENCOUNTER — Encounter (HOSPITAL_BASED_OUTPATIENT_CLINIC_OR_DEPARTMENT_OTHER): Payer: PRIVATE HEALTH INSURANCE

## 2016-06-25 NOTE — Telephone Encounter (Signed)
Rec'd call from woman, highly agitated because she evidently had her receptionist fax over paperwork for this Pt to see the psychiatrist here at [facility] for medication management.  According to front desk staff for [facility], Pt was told that insurance company could not verify health insurance plan and he would be a selfpaying Pt for today's visit.  Pt left and called back to referring practice and Dr. Clinton Sawyer called to confront the outcome.   Pt was already in vehicle and on his way back to [employer].  Dr. Clinton Sawyer and this writer resolved to the fact that her practice was not happy and she would not be thankful for the experience at [facility].  Very unfortunate for the Pt.  This Pt had a telemedicine visit done by [facility] on 06/22/16.

## 2016-06-25 NOTE — ED Provider Notes (Signed)
No Known Allergies    Review of MLP charts:  I, Fara Olden, MD  have reviewed the history, physical exam, evaluation, clinical impression, plan and agree.  I am available to discuss patient's care and see patient if needed    MDM: Based on advanced practitioners reported history and exam, at this time I do not think patient is an acute risk to himself or others. Was given suicide prevention and strict return instructions.      *This note was generated by the Epic EMR system/ Dragon speech recognition and may contain inherent errors or omissions not intended by the user. Grammatical errors, random word insertions, deletions, pronoun errors and incomplete sentences are occasional consequences of this technology due to software limitations. Not all errors are caught or corrected. If there are questions or concerns about the content of this note or information contained within the body of this dictation they should be addressed directly with the author for clarification

## 2016-11-03 ENCOUNTER — Emergency Department
Admission: EM | Admit: 2016-11-03 | Discharge: 2016-11-03 | Disposition: A | Discharge status: Home / Self Care | Payer: PRIVATE HEALTH INSURANCE | Attending: Emergency Medicine | Admitting: Emergency Medicine

## 2016-11-03 DIAGNOSIS — F458 Other somatoform disorders: Secondary | ICD-10-CM

## 2016-11-03 DIAGNOSIS — F410 Panic disorder [episodic paroxysmal anxiety] without agoraphobia: Secondary | ICD-10-CM

## 2016-11-03 DIAGNOSIS — F17290 Nicotine dependence, other tobacco product, uncomplicated: Secondary | ICD-10-CM | POA: Insufficient documentation

## 2016-11-03 DIAGNOSIS — R064 Hyperventilation: Secondary | ICD-10-CM | POA: Insufficient documentation

## 2016-11-03 LAB — CBC AND DIFFERENTIAL
Absolute NRBC: 0 10*3/uL
Basophils Absolute Automated: 0.02 10*3/uL (ref 0.00–0.20)
Basophils Automated: 0.3 %
Eosinophils Absolute Automated: 0.04 10*3/uL (ref 0.00–0.70)
Eosinophils Automated: 0.7 %
Hematocrit: 40.7 % — ABNORMAL LOW (ref 42.0–52.0)
Hgb: 13.8 g/dL (ref 13.0–17.0)
Immature Granulocytes Absolute: 0.01 10*3/uL
Immature Granulocytes: 0.2 %
Lymphocytes Absolute Automated: 2.7 10*3/uL (ref 0.50–4.40)
Lymphocytes Automated: 45.1 %
MCH: 29 pg (ref 28.0–32.0)
MCHC: 33.9 g/dL (ref 32.0–36.0)
MCV: 85.5 fL (ref 80.0–100.0)
MPV: 11 fL (ref 9.4–12.3)
Monocytes Absolute Automated: 0.35 10*3/uL (ref 0.00–1.20)
Monocytes: 5.8 %
Neutrophils Absolute: 2.87 10*3/uL (ref 1.80–8.10)
Neutrophils: 47.9 %
Nucleated RBC: 0 /100 WBC (ref 0.0–1.0)
Platelets: 160 10*3/uL (ref 140–400)
RBC: 4.76 10*6/uL (ref 4.70–6.00)
RDW: 12 % (ref 12–15)
WBC: 5.99 10*3/uL (ref 3.50–10.80)

## 2016-11-03 LAB — COMPREHENSIVE METABOLIC PANEL
ALT: 15 U/L (ref 0–55)
AST (SGOT): 17 U/L (ref 5–34)
Albumin/Globulin Ratio: 1.3 (ref 0.9–2.2)
Albumin: 4.3 g/dL (ref 3.5–5.0)
Alkaline Phosphatase: 58 U/L (ref 38–106)
Anion Gap: 11 (ref 5.0–15.0)
BUN: 12.1 mg/dL (ref 9.0–28.0)
Bilirubin, Total: 1 mg/dL (ref 0.2–1.2)
CO2: 23 mEq/L (ref 22–29)
Calcium: 9.8 mg/dL (ref 8.5–10.5)
Chloride: 105 mEq/L (ref 100–111)
Creatinine: 1.2 mg/dL (ref 0.7–1.3)
Globulin: 3.3 g/dL (ref 2.0–3.6)
Glucose: 100 mg/dL (ref 70–100)
Potassium: 3.6 mEq/L (ref 3.5–5.1)
Protein, Total: 7.6 g/dL (ref 6.0–8.3)
Sodium: 139 mEq/L (ref 136–145)

## 2016-11-03 LAB — CK: Creatine Kinase (CK): 231 U/L (ref 47–267)

## 2016-11-03 LAB — GFR: EGFR: >60

## 2016-11-03 NOTE — ED Provider Notes (Addendum)
Physician/Midlevel provider first contact with patient: 11/03/16 1434         History     Chief Complaint   Patient presents with   . Anxiety   . Panic Attack     This 31 year old male presents with severe anxiety.  Patient states he has a history of panic attacks and feels like this was one of the worst ones he has had.  Patient was at work when symptoms started.  He states he was under a lot of pressure at home, with stressors.  He is a history of hypertension but no history of heart disease.  He presented by ambulance here and starting to improve.  Patient states he recently started Zoloft and Xanax.  He only takes Xanax occasionally, last dose was last night.  Patient states during this episode, he had hyperventilation cramping in the hands, feet and numbness in the fingers, toes and facial area.  This has since improved.  He still has some cramping in the left forearm.  Pain currently is mild.  He does not want any medication.  Patient did describe some chest tightness earlier but this has resolved.  He states it always happens when he has panic attacks.    Nothing significant better or worse.                 Past Medical History:   Diagnosis Date   . Anxiety        History reviewed. No surgical history.    No family history on file.    Social  Social History   Substance Use Topics   . Smoking status: Light Tobacco Smoker   . Smokeless tobacco: Never Used   . Alcohol use No      Comment: socially        .     No Known Allergies    Home Medications     Med List Status:  In Progress Set By: Evangeline Gula, RN at 11/03/2016  2:41 PM                ALPRAZolam (XANAX) 0.25 MG tablet     Take 0.25 mg by mouth nightly as needed.     sertraline (ZOLOFT) 100 MG tablet     Take 100 mg by mouth daily.           Review of Systems   Constitutional: Negative for diaphoresis and fever.   HENT: Negative for congestion, ear pain and sore throat.    Eyes: Negative for pain and visual disturbance.   Respiratory: Positive for  chest tightness and shortness of breath. Negative for cough and wheezing.    Cardiovascular: Negative for palpitations and leg swelling.   Gastrointestinal: Negative for abdominal pain, constipation, diarrhea, nausea and vomiting.   Genitourinary: Negative for dysuria and urgency.   Musculoskeletal: Negative for back pain, gait problem and neck pain.   Skin: Negative for color change and rash.   Neurological: Positive for light-headedness. Negative for dizziness, weakness and headaches.        Tingling in hands, feet and face.   Psychiatric/Behavioral: Negative for confusion. The patient is nervous/anxious.        Physical Exam    BP: 143/88, Heart Rate: 66, Temp: 99 F (37.2 C), Resp Rate: 17, SpO2: 99 %, Weight: 108.4 kg    Physical Exam   Constitutional: He is oriented to person, place, and time. He appears well-developed and well-nourished.   HENT:   Head: Normocephalic  and atraumatic.   Right Ear: External ear normal.   Left Ear: External ear normal.   Nose: Nose normal.   Mouth/Throat: Oropharynx is clear and moist. No oropharyngeal exudate.   Eyes: Pupils are equal, round, and reactive to light. Conjunctivae are normal. Right eye exhibits no discharge. Left eye exhibits no discharge. No scleral icterus.   Neck: Normal range of motion. Neck supple. No JVD present. No tracheal deviation present.   Cardiovascular: Normal rate, regular rhythm, normal heart sounds and intact distal pulses.    No murmur heard.  Pulmonary/Chest: Effort normal and breath sounds normal. He has no wheezes. He exhibits no tenderness.   Abdominal: Soft. Bowel sounds are normal. He exhibits no distension. There is no tenderness. There is no guarding.   Musculoskeletal: Normal range of motion. He exhibits no edema or tenderness.   Neurological: He is alert and oriented to person, place, and time. He has normal strength. No cranial nerve deficit or sensory deficit. Coordination normal. GCS eye subscore is 4. GCS verbal subscore is 5. GCS  motor subscore is 6.   Skin: Skin is warm and dry. He is not diaphoretic. No pallor.   Psychiatric: His speech is normal and behavior is normal. Judgment and thought content normal. His mood appears anxious. Cognition and memory are normal.   Nursing note and vitals reviewed.        MDM and ED Course     ED Medication Orders     None             MDM  Number of Diagnoses or Management Options  Hyperventilation syndrome:   Panic attack:   Diagnosis management comments: Diagnosis management comments: Oxygen saturation by pulse oximetry is 95%-100%, Normal.  Interventions: None Needed.      EKG Interpretation    EKG interpreted by Melvern Sample, DO  Time: 1502  Rate: Normal  Rhythm: sinus rhythm  Axis: Normal  ST Segments ant T waves: No acute ST or T-wave changes.  Conduction: RBBB (Incomplete)  Impression: Non-specific EKG, essentially unchanged from June 22, 2016    [facility] Dx:Anxiety, hyperventilation syndrome, SVT, a-fib, a-flutter, dehydration, electrolyte disorder. Clinically no PE or MI.    Reade Z Blancett was re-assed and is feeling better. Pain improved. Stable for discharge.     Patient is alert and oriented 3.  On reexamination, vital signs were stable, and patient is in no acute distress.  At this point he is stable for discharge.  Lajean Silvius had an opportunity to ask questions and he verbalized understanding of instructions.      I have reviewed the nursing history.    DR. Melvern Sample  is the primary attending for this patient and has obtained and performed the history, PE, and medical decision making for this patient.      Results     Procedure Component Value Units Date/Time    Comprehensive metabolic panel ([id number]) Collected:  11/03/16 1455    Specimen:  Blood Updated:  11/03/16 1531     Glucose 100 mg/dL      BUN 40.9 mg/dL      Creatinine 1.2 mg/dL      Sodium 811 mEq/L      Potassium 3.6 mEq/L      Chloride 105 mEq/L      CO2 23 mEq/L      Calcium 9.8 mg/dL      Protein, Total 7.6 g/dL      Albumin 4.3  g/dL  AST (SGOT) 17 U/L      ALT 15 U/L      Alkaline Phosphatase 58 U/L      Bilirubin, Total 1.0 mg/dL      Globulin 3.3 g/dL      Albumin/Globulin Ratio 1.3     Anion Gap 11.0    GFR ([id number]) Collected:  11/03/16 1455     Updated:  11/03/16 1531     EGFR >60.0    Creatine Kinase (CK) ([id number]) Collected:  11/03/16 1455    Specimen:  Blood Updated:  11/03/16 1531     Creatine Kinase (CK) 231 U/L     CBC with differential ([id number])  (Abnormal) Collected:  11/03/16 1455    Specimen:  Blood from Blood Updated:  11/03/16 1506     WBC 5.99 x10 3/uL      Hgb 13.8 g/dL      Hematocrit 44.0 (L) %      Platelets 160 x10 3/uL      RBC 4.76 x10 6/uL      MCV 85.5 fL      MCH 29.0 pg      MCHC 33.9 g/dL      RDW 12 %      MPV 11.0 fL      Neutrophils 47.9 %      Lymphocytes Automated 45.1 %      Monocytes 5.8 %      Eosinophils Automated 0.7 %      Basophils Automated 0.3 %      Immature Granulocyte 0.2 %      Nucleated RBC 0.0 /100 WBC      Neutrophils Absolute 2.87 x10 3/uL      Abs Lymph Automated 2.70 x10 3/uL      Abs Mono Automated 0.35 x10 3/uL      Abs Eos Automated 0.04 x10 3/uL      Absolute Baso Automated 0.02 x10 3/uL      Absolute Immature Granulocyte 0.01 x10 3/uL      Absolute NRBC 0.00 x10 3/uL         Radiology Results (24 Hour)     ** No results found for the last 24 hours. **        *This note was generated by the Epic EMR system/ Dragon speech recognition and may contain inherent errors or omissions not intended by the user. Grammatical errors, random word insertions, deletions, pronoun errors and incomplete sentences are occasional consequences of this technology due to software limitations. Not all errors are caught or corrected. If there are questions or concerns about the content of this note or information contained within the body of this dictation they should be addressed directly with the author for clarification      .         Amount and/or Complexity of Data Reviewed  Clinical lab  tests: ordered and reviewed  Tests in the medicine section of CPT: ordered and reviewed    Procedures    Clinical Impression & Disposition     Clinical Impression  Final diagnoses:   Panic attack   Hyperventilation syndrome        ED Disposition     ED Disposition Condition Date/Time Comment    Discharge  Thu Nov 03, 2016  3:56 PM Lajean Silvius discharge to home/self care.    Condition at disposition: Stable           Discharge Medication List as of 11/03/2016  3:56 PM  Melvern Sample, DO  11/05/16 [id number]       Melvern Sample, DO  11/05/16 [phone]

## 2016-11-03 NOTE — Discharge Instructions (Signed)
Anxiety, Panic    You have been diagnosed with an anxiety attack.    You seem to have had an anxiety attack. There are many conditions that can cause symptoms like these. If this is the first time this has happened, follow-up with your regular doctor. You may need more testing to be sure there isn't another cause for your symptoms.    Anxiety causes very strong feelings of worry and fear. It may also cause chest pain or shortness of breath. You may feel like you have palpitations (a racing heart). You might feel numbness (like parts of your body are "asleep"), especially around the mouth and in the hands or feet.    Follow up with your counselor and family doctor. If you do not have an appointment in the next 2-3 days, call and make one. It is VERY IMPORTANT for your counselor and family doctor to know if you get worse.    YOU SHOULD SEEK MEDICAL ATTENTION IMMEDIATELY, EITHER HERE OR AT THE NEAREST EMERGENCY DEPARTMENT, IF ANY OF THE FOLLOWING OCCURS:   You have symptoms that you normally don't have with your anxiety attacks.   You think of harming yourself (suicidal thoughts) or harming someone else.   You have symptoms you normally don't have and they last longer than normal or your medicine doesn t help. These include chest pain, passing out, feeling that your heart is racing or shortness of breath.   You have a fever (temperature higher than 100.4F / 38C).              Hyperventilation    You have been diagnosed with hyperventilation syndrome.    Hyperventilation means breathing too fast. This can happen for many reasons. It happens when you are breathing too deeply or too fast (or both). When your normal breathing changes, the chemistry of your blood changes a little. This can lead to many uncomfortable symptoms.    Symptoms of hyperventilation include:   The feeling that you can t get enough air even though you are breathing quickly and deeply.   Your chest hurts from trying to breathe too  hard. This pain can be an ache or a burning sensation. It can even be a sharp, stabbing feeling.   You feel numbness or tingling in your face, especially around the lips and mouth.   You feel numbness or tingling in your hands or feet.   There is muscle cramping in the hands (and feet).    Hyperventilation can have non-dangerous causes like anxiety or increased stress. It can also be a reaction to an emotional situation or a painful injury. Sometimes, people who have asthma or bronchitis (or any illness that makes it hard to breathe) hyperventilate when they try to breathe harder and overcome the increased work of breathing. Sometimes, the cause can be something more serious. The cause could be pneumonia or a blood clot in the lungs (pulmonary embolism).    Hyperventilation is normally treated by relaxing and trying to slow down your breathing. Blowing into a paper bag and breathing the exhaled air back in may or may not be helpful.    The cause of your hyperventilation does not seem to be dangerous. It is OK for you to leave at this time.    You should follow up with your doctor or the referral doctor.    YOU SHOULD SEEK MEDICAL ATTENTION IMMEDIATELY, EITHER HERE OR AT THE NEAREST EMERGENCY DEPARTMENT, IF ANY OF THE FOLLOWING OCCURS:     Your symptoms get worse.   You have increased shortness of breath or breathing is more difficult.   You have bad chest pain.   You have any swelling in one leg with or without pain in your calf.   You run a fever (temperature higher than 100.4F / 38C) or have a cough that brings up blood or yellow-green mucus (sputum).

## 2016-11-03 NOTE — ED Triage Notes (Signed)
Patient brought in by EMS for anxiety/panic attack. He was in his car at work hyperventilating and shaking. He has hx of anzieyu and was given prescription by pcp for xanex, he states he took one this morning around 7am and had a panic attack this afternoon. He states "I have been feeling shitty the past couple days, feeling down and depressed." He states he had a few day break in between taking his depression medications.

## 2016-11-04 LAB — ECG 12-LEAD
Atrial Rate: 66 {beats}/min
P Axis: 50 degrees
P-R Interval: 150 ms
Q-T Interval: 408 ms
QRS Duration: 110 ms
QTC Calculation (Bezet): 427 ms
R Axis: 28 degrees
T Axis: 30 degrees
Ventricular Rate: 66 {beats}/min

## 2017-02-28 ENCOUNTER — Emergency Department
Admission: EM | Admit: 2017-02-28 | Discharge: 2017-02-28 | Disposition: A | Discharge status: Other Acute Inpatient Hospital | Payer: PRIVATE HEALTH INSURANCE | Attending: Emergency Medicine | Admitting: Emergency Medicine

## 2017-02-28 ENCOUNTER — Emergency Department: Payer: PRIVATE HEALTH INSURANCE

## 2017-02-28 ENCOUNTER — Encounter (HOSPITAL_BASED_OUTPATIENT_CLINIC_OR_DEPARTMENT_OTHER): Payer: Self-pay

## 2017-02-28 DIAGNOSIS — F32A Depression, unspecified: Secondary | ICD-10-CM

## 2017-02-28 DIAGNOSIS — F329 Major depressive disorder, single episode, unspecified: Secondary | ICD-10-CM | POA: Insufficient documentation

## 2017-02-28 DIAGNOSIS — F17200 Nicotine dependence, unspecified, uncomplicated: Secondary | ICD-10-CM | POA: Insufficient documentation

## 2017-02-28 LAB — COMPREHENSIVE METABOLIC PANEL
ALT: 21 U/L (ref 0–55)
AST (SGOT): 20 U/L (ref 5–34)
Albumin/Globulin Ratio: 1.3 (ref 0.9–2.2)
Albumin: 4.4 g/dL (ref 3.5–5.0)
Alkaline Phosphatase: 65 U/L (ref 38–106)
Anion Gap: 9 (ref 5.0–15.0)
BUN: 10 mg/dL (ref 9.0–28.0)
Bilirubin, Total: 0.7 mg/dL (ref 0.2–1.2)
CO2: 30 mEq/L — ABNORMAL HIGH (ref 22–29)
Calcium: 9.9 mg/dL (ref 8.5–10.5)
Chloride: 101 mEq/L (ref 100–111)
Creatinine: 1.2 mg/dL (ref 0.7–1.3)
Globulin: 3.4 g/dL (ref 2.0–3.6)
Glucose: 93 mg/dL (ref 70–100)
Potassium: 4 mEq/L (ref 3.5–5.1)
Protein, Total: 7.8 g/dL (ref 6.0–8.3)
Sodium: 140 mEq/L (ref 136–145)

## 2017-02-28 LAB — ETHANOL: Alcohol: NOT DETECTED mg/dL

## 2017-02-28 LAB — URINALYSIS
Bilirubin, UA: NEGATIVE
Blood, UA: NEGATIVE
Glucose, UA: NEGATIVE
Ketones UA: NEGATIVE
Leukocyte Esterase, UA: NEGATIVE
Nitrite, UA: NEGATIVE
Protein, UR: NEGATIVE
Specific Gravity UA: 1.01 (ref 1.001–1.035)
Urine pH: 7 (ref 5.0–8.0)
Urobilinogen, UA: 0.2 mg/dL

## 2017-02-28 LAB — ACETAMINOPHEN LEVEL: Acetaminophen Level: <7 ug/mL — ABNORMAL LOW (ref 10–30)

## 2017-02-28 LAB — CBC AND DIFFERENTIAL
Absolute NRBC: 0 10*3/uL
Basophils Absolute Automated: 0.02 10*3/uL (ref 0.00–0.20)
Basophils Automated: 0.4 %
Eosinophils Absolute Automated: 0.03 10*3/uL (ref 0.00–0.70)
Eosinophils Automated: 0.6 %
Hematocrit: 42.4 % (ref 42.0–52.0)
Hgb: 13.6 g/dL (ref 13.0–17.0)
Immature Granulocytes Absolute: 0.02 10*3/uL
Immature Granulocytes: 0.4 %
Lymphocytes Absolute Automated: 1.84 10*3/uL (ref 0.50–4.40)
Lymphocytes Automated: 34 %
MCH: 28.1 pg (ref 28.0–32.0)
MCHC: 32.1 g/dL (ref 32.0–36.0)
MCV: 87.6 fL (ref 80.0–100.0)
MPV: 10.6 fL (ref 9.4–12.3)
Monocytes Absolute Automated: 0.39 10*3/uL (ref 0.00–1.20)
Monocytes: 7.2 %
Neutrophils Absolute: 3.11 10*3/uL (ref 1.80–8.10)
Neutrophils: 57.4 %
Nucleated RBC: 0 /100 WBC (ref 0.0–1.0)
Platelets: 187 10*3/uL (ref 140–400)
RBC: 4.84 10*6/uL (ref 4.70–6.00)
RDW: 12 % (ref 12–15)
WBC: 5.41 10*3/uL (ref 3.50–10.80)

## 2017-02-28 LAB — SALICYLATE LEVEL: Salicylate Level: <5 mg/dL — ABNORMAL LOW (ref 15.0–30.0)

## 2017-02-28 LAB — RAPID DRUG SCREEN, URINE
Barbiturate Screen, UR: NEGATIVE
Benzodiazepine Screen, UR: POSITIVE — AB
Cannabinoid Screen, UR: POSITIVE — AB
Cocaine, UR: NEGATIVE
Opiate Screen, UR: NEGATIVE
PCP Screen, UR: NEGATIVE
Urine Amphetamine Screen: NEGATIVE

## 2017-02-28 LAB — GFR: EGFR: >60

## 2017-02-28 LAB — TSH: TSH: 1.46 u[IU]/mL (ref 0.35–4.94)

## 2017-02-28 LAB — MAGNESIUM: Magnesium: 2 mg/dL (ref 1.6–2.6)

## 2017-02-28 LAB — PHOSPHORUS: Phosphorus: 3.5 mg/dL (ref 2.3–4.7)

## 2017-02-28 MED ORDER — LORAZEPAM 1 MG PO TABS
1.00 mg | ORAL_TABLET | Freq: Once | ORAL | Status: AC
Start: 2017-02-28 — End: 2017-02-28
  Administered 2017-02-28: 21:00:00 1 mg via ORAL
  Filled 2017-02-28: qty 1

## 2017-02-28 NOTE — ED Notes (Signed)
Phoned [facility] and spoke with Cara. Continuing bed search at this time

## 2017-02-28 NOTE — Progress Notes (Signed)
TDO Prescreen      CSB: [facility]    CSB contact & phone number: Karoline Caldwell [phone]    Time CSB contacted Central Access: prescreen received at 1700    Where is the pt now: [facility] ED    Is the pt on an ECO: Yes    What time did the ECO begin: 1443    Presenting Problem & Associated Symptoms: Made suicidal statements to girlfriend, also states hearing voices telling him to give up and quit. States he feels helpless. Prior attempt in 2017 by OD.    Unable to care for self: Yes - reckless spending when depressed, reports not eating or sleep when depressed    Risk for harm to self: Yes - suicidal, voices telling him to give up    Does the pt have injuries that are self-inflicted: No    If yes, did they or will they require medical tx in the ER today: N/A    Risk for harm to others: No     Does pt have a hx of aggression/violence: No    If yes, when was the last known incident and to whom was it directed towards: N/A    Does the pt have any current legal issues (upcoming court dates, on probation): Yes court date in April for shopping lifting     Previous/Current Psych Dx:  depression and anxiety , F32.1 MDD, single episode, F41.9 Anxiety unspecified    Is there any Substance Abuse History: Yes marijuana smoked 2 weeks ago - prescribed xanax does not report abuse    When was the last known use: Marijuana 2 weeks ago    Are there any medical issues: Yes states has an irregular heart beat     Current Medications: Sertraline, Bupropion, Xanax    Do you know if the pt has been medicated in the ED: No    Pt's housing status: domiciled                                                    Can the pt return home: clinican is not aware    Does the pt have insurance: Yes, UHC    Name and telephone number of insurance (if pt has Medical sales representative or Medicare, please verify that is is not a managed care plan; ie Programmer, applications or Medicare [employer] Gold):     NOTE:  Insurance authorization must be obtained by central access staff, prior to  admission, please document the authorization once it is obtained    Who will serve as the petitioner: pt voluntary    Contact number for the petitioner: N/A    Is the pt currently an open case with the CSB: No started intake today    Treatment providers (CSB or private) name: [place]     Has the pt been hospitalized before: No    Date and place of last hospitalization:     All of the clinical information was obtained by Angie from the CSB via patient and girl friend    Pt declined by Dr. Westley Gambles - not enough information regarding the schedule and dosage of prescribed xanax, also concerned individual will not participate in treatment.

## 2017-02-28 NOTE — ED Triage Notes (Signed)
Pt came in with [facility]. Pt currently under ECO. Pt states hx of depression and he has been hearing voices telling him he's worthless and having feeling he is doing things which he has not done. [facility] here speaking with pt at bedside.

## 2017-02-28 NOTE — ED Provider Notes (Signed)
Physician/Midlevel provider first contact with patient: 02/28/17 1510         History     Chief Complaint   Patient presents with   . Psychiatric Evaluation     Pt presents with the police as an ECO.  Pt has been very depressed, possible hallucinating and suicidal.  Pt states he has had a non-productive cough for the past 3 weeks.  No SOB.  Pt states he has also had cold sweats and feels like he has had the flu.        The history is provided by the patient. No language interpreter was used.   Mental Health Problem   Presenting symptoms: depression, hallucinations and suicidal thoughts    Presenting symptoms: no agitation    Patient accompanied by:  Law enforcement  Degree of incapacity (severity):  Severe  Onset quality:  Gradual  Timing:  Unable to specify  Progression:  Worsening  Chronicity:  Recurrent  Treatment compliance:  All of the time  Relieved by:  Nothing  Worsened by:  Nothing  Ineffective treatments:  Antidepressants and anti-anxiety medications  Associated symptoms: no abdominal pain, no headaches and no weight change             Past Medical History:   Diagnosis Date   . Anxiety    . Anxiety    . Depression        History reviewed. No pertinent surgical history.    History reviewed. No pertinent family history.    Social  Social History   Substance Use Topics   . Smoking status: Light Tobacco Smoker   . Smokeless tobacco: Never Used   . Alcohol use No      Comment: socially        .     No Known Allergies    Home Medications             ALPRAZolam (XANAX) 0.25 MG tablet     Take 0.25 mg by mouth 2 (two) times daily as needed.         buPROPion (WELLBUTRIN) 100 MG tablet     Take 100 mg by mouth 2 (two) times daily.     sertraline (ZOLOFT) 100 MG tablet     Take 200 mg by mouth daily.               Review of Systems   Constitutional: Negative for chills and fever.   Gastrointestinal: Negative for abdominal pain, nausea and vomiting.   Musculoskeletal: Negative for back pain and neck pain.   Skin:  Negative for color change, pallor, rash and wound.   Neurological: Negative for weakness, numbness and headaches.   Hematological: Negative for adenopathy. Does not bruise/bleed easily.   Psychiatric/Behavioral: Positive for hallucinations and suicidal ideas. Negative for agitation.       Physical Exam    BP: (!) 143/97, Heart Rate: 68, Temp: 99.3 F (37.4 C), Resp Rate: 20, SpO2: 100 %, Weight: 101.3 kg    Physical Exam   Constitutional: He is oriented to person, place, and time. He appears well-developed and well-nourished. No distress.   HENT:   Head: Normocephalic and atraumatic.   Right Ear: External ear normal.   Left Ear: External ear normal.   Nose: Nose normal.   Mouth/Throat: Oropharynx is clear and moist. No oropharyngeal exudate.   Eyes: Pupils are equal, round, and reactive to light. Conjunctivae are normal. Right eye exhibits no discharge. Left eye exhibits no discharge. No  scleral icterus.   Neck: Normal range of motion. Neck supple.   Cardiovascular: Normal rate, regular rhythm, normal heart sounds and intact distal pulses.  Exam reveals no gallop and no friction rub.    No murmur heard.  Pulmonary/Chest: Effort normal and breath sounds normal. No respiratory distress. He has no wheezes. He has no rales. He exhibits no tenderness.   Abdominal: Soft. Bowel sounds are normal. He exhibits no distension and no mass. There is no tenderness. There is no rebound and no guarding. No hernia.   Musculoskeletal: Normal range of motion.   Neurological: He is alert and oriented to person, place, and time.   Skin: Skin is warm and dry. Capillary refill takes less than 2 seconds. He is not diaphoretic.   Psychiatric: His speech is normal. Judgment normal. His mood appears not anxious. He is slowed and withdrawn. He is not agitated, not aggressive, not hyperactive and not combative. Thought content is not paranoid and not delusional. Cognition and memory are normal. He exhibits a depressed mood. He expresses  suicidal ideation. He expresses no homicidal ideation. He expresses no homicidal plans.   Nursing note and vitals reviewed.        MDM and ED Course     ED Medication Orders     None             MDM  Number of Diagnoses or Management Options  Depression, unspecified depression type:   Diagnosis management comments: [name] , PA-C, have been the primary provider for Cesar Hicks during this Emergency Dept visit. The attending signature signifies review and agreement of the history, physical exam, evaluation, clinical impression and plan except as noted.   I have reviewed the nursing notes, including Past medical and surgical,Family and Social History.     Pt evaluated by [facility] on arrival and he will need to be admitted.  She states he is voluntary but will keep the eco.        EKG Interpretation    EKG interpreted by:Dr Nedra Hai  Time: 1553  Rate: Normal  Rhythm: sinus rhythm  Axis: Normal  ST Segments: No acute ST segment changes  T waves: No acute T Wave changes  Conduction: Incomplete RBBB  Impression: Non-specific EKG    [facility] doing bed search.      Pt accepted at [facility].                    Amount and/or Complexity of Data Reviewed  Clinical lab tests: ordered and reviewed  Tests in the radiology section of CPT: ordered and reviewed  Discuss the patient with other providers: yes (Discussed with Dr Nedra Hai)    Risk of Complications, Morbidity, and/or Mortality  Presenting problems: moderate  Diagnostic procedures: moderate  Management options: moderate    Patient Progress  Patient progress: stable           Results     Procedure Component Value Units Date/Time    Urine Rapid Drug Screen [[id number]]  (Abnormal) Collected:  02/28/17 1651    Specimen:  Urine Updated:  02/28/17 1718     Amphetamine Screen, UR Negative     Barbiturate Screen, UR Negative     Benzodiazepine Screen, UR Positive (A)     Cannabinoid Screen, UR Positive (A)     Cocaine, UR Negative     Opiate Screen, UR Negative     PCP Screen, UR Negative     UA with reflex to micro (all  freestanding ED's except [facility], pts 3 + yrs) [[id number]] Collected:  02/28/17 1651    Specimen:  Urine Updated:  02/28/17 1701     Urine Type Clean Catch     Color, UA YELLOW     Clarity, UA CLEAR     Specific Gravity UA 1.010     Urine pH 7.0     Leukocyte Esterase, UA NEGATIVE     Nitrite, UA NEGATIVE     Protein, UR NEGATIVE     Glucose, UA NEGATIVE     Ketones UA NEGATIVE     Urobilinogen, UA 0.2 mg/dL      Bilirubin, UA NEGATIVE     Blood, UA NEGATIVE    TSH [[id number]] Collected:  02/28/17 1558    Specimen:  Blood Updated:  02/28/17 1651     TSH 1.46 uIU/mL     Alcohol (Ethanol)  Level [[id number]] Collected:  02/28/17 1558    Specimen:  Blood Updated:  02/28/17 1633     Alcohol None Detected mg/dL     Acetaminophen level [[id number]]  (Abnormal) Collected:  02/28/17 1558    Specimen:  Blood Updated:  02/28/17 1633     Acetaminophen Level <7 (L) ug/mL     ASA  level [[id number]]  (Abnormal) Collected:  02/28/17 1558    Specimen:  Blood Updated:  02/28/17 1633     Salicylate Level <5.0 (L) mg/dL     GFR [[id number]] Collected:  02/28/17 1558     Updated:  02/28/17 1633     EGFR >60.0    Comprehensive metabolic panel (CMP) [[id number]]  (Abnormal) Collected:  02/28/17 1558    Specimen:  Blood Updated:  02/28/17 1633     Glucose 93 mg/dL      BUN 74.2 mg/dL      Creatinine 1.2 mg/dL      Sodium 595 mEq/L      Potassium 4.0 mEq/L      Chloride 101 mEq/L      CO2 30 (H) mEq/L      Calcium 9.9 mg/dL      Protein, Total 7.8 g/dL      Albumin 4.4 g/dL      AST (SGOT) 20 U/L      ALT 21 U/L      Alkaline Phosphatase 65 U/L      Bilirubin, Total 0.7 mg/dL      Globulin 3.4 g/dL      Albumin/Globulin Ratio 1.3     Anion Gap 9.0    Magnesium [[id number]] Collected:  02/28/17 1558    Specimen:  Blood Updated:  02/28/17 1633     Magnesium 2.0 mg/dL     Phosphorus [[id number]] Collected:  02/28/17 1558    Specimen:  Blood Updated:  02/28/17 1633     Phosphorus 3.5 mg/dL     Rapid  influenza A/B antigens [[id number]] Collected:  02/28/17 1559    Specimen:  Nasopharyngeal from Nasal Wash Updated:  02/28/17 1621    Narrative:       ORDER#: [id number]                                    ORDERED BY: Clementeen Graham, Indra Wolters  SOURCE: Nasal Wash                                   COLLECTED:  02/28/17 15:59  ANTIBIOTICS AT COLL.:                                RECEIVED :  02/28/17 16:06  Influenza Rapid Antigen A&B                FINAL       02/28/17 16:21  02/28/17   Negative for Influenza A and B              16:21  02/28/2017             Reference Range: Negative      CBC with differential [[id number]] Collected:  02/28/17 1558    Specimen:  Blood from Blood Updated:  02/28/17 1611     WBC 5.41 x10 3/uL      Hgb 13.6 g/dL      Hematocrit 47.8 %      Platelets 187 x10 3/uL      RBC 4.84 x10 6/uL      MCV 87.6 fL      MCH 28.1 pg      MCHC 32.1 g/dL      RDW 12 %      MPV 10.6 fL      Neutrophils 57.4 %      Lymphocytes Automated 34.0 %      Monocytes 7.2 %      Eosinophils Automated 0.6 %      Basophils Automated 0.4 %      Immature Granulocyte 0.4 %      Nucleated RBC 0.0 /100 WBC      Neutrophils Absolute 3.11 x10 3/uL      Abs Lymph Automated 1.84 x10 3/uL      Abs Mono Automated 0.39 x10 3/uL      Abs Eos Automated 0.03 x10 3/uL      Absolute Baso Automated 0.02 x10 3/uL      Absolute Immature Granulocyte 0.02 x10 3/uL      Absolute NRBC 0.00 x10 3/uL         Radiology Results (24 Hour)     Procedure Component Value Units Date/Time    Chest 2 Views [[id number]] Collected:  02/28/17 1622    Order Status:  Completed Updated:  02/28/17 1627    Narrative:       CLINICAL INFORMATION: 32 year old. Chronic cough.    TECHNIQUE AND FINDINGS: Chest: PA and lateral. No comparisons.    Normal lung volumes. No pneumonia, atelectasis, suspicious mass, or  interstitial lung abnormality. Bronchial markings within normal limits.  No pleural fluid or thickening. Normal heart, aorta, and pulmonary  vasculature. No hilar or  mediastinal enlargement. No significant osseous  findings. No destructive bone lesions.      Impression:        No evidence of active chest disease.    Annabell Sabal, MD   02/28/2017 4:23 PM                Procedures    Clinical Impression & Disposition     Clinical Impression  Final diagnoses:   Depression, unspecified depression type        ED Disposition     ED Disposition Condition Date/Time Comment    Transfer to Another Facility  Tue Feb 28, 2017  7:43 PM Cesar Hicks should be transferred out to [facility] Prescriptions  No medications on file                 Ulla Gallo, [place]  02/28/17 1947       Ollen Barges, MD  03/04/17 [phone]

## 2017-03-01 LAB — ECG 12-LEAD
Atrial Rate: 62 {beats}/min
P Axis: 65 degrees
P-R Interval: 140 ms
Q-T Interval: 410 ms
QRS Duration: 106 ms
QTC Calculation (Bezet): 416 ms
R Axis: 38 degrees
T Axis: 31 degrees
Ventricular Rate: 62 {beats}/min

## 2017-03-21 IMAGING — DX DG ANKLE COMPLETE 3+V*R*
3 series · 3 of 3 positions shown · non-contrast
Comparison: None.

CLINICAL DATA: Right ankle pain and swelling after falling down
stairs 2 months ago.

EXAM:
RIGHT ANKLE - COMPLETE 3+ VIEW

[ankle ap]
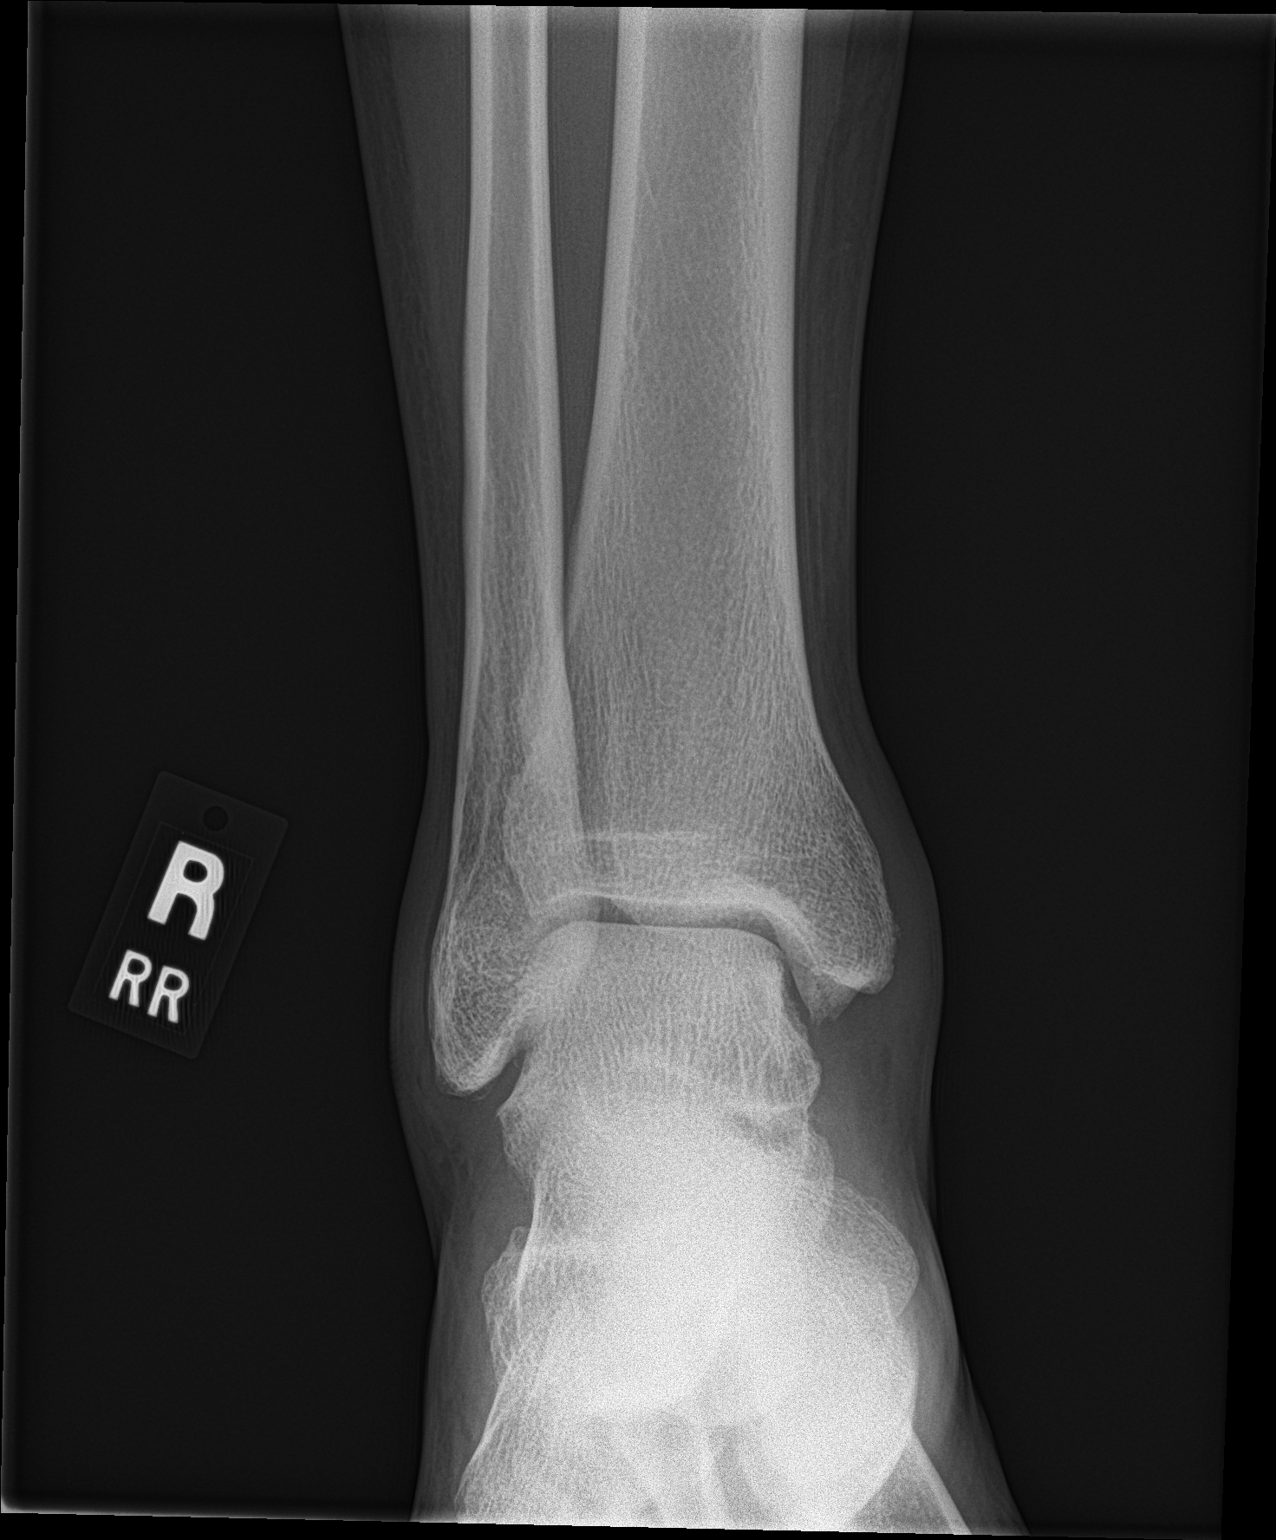

[ankle obl]
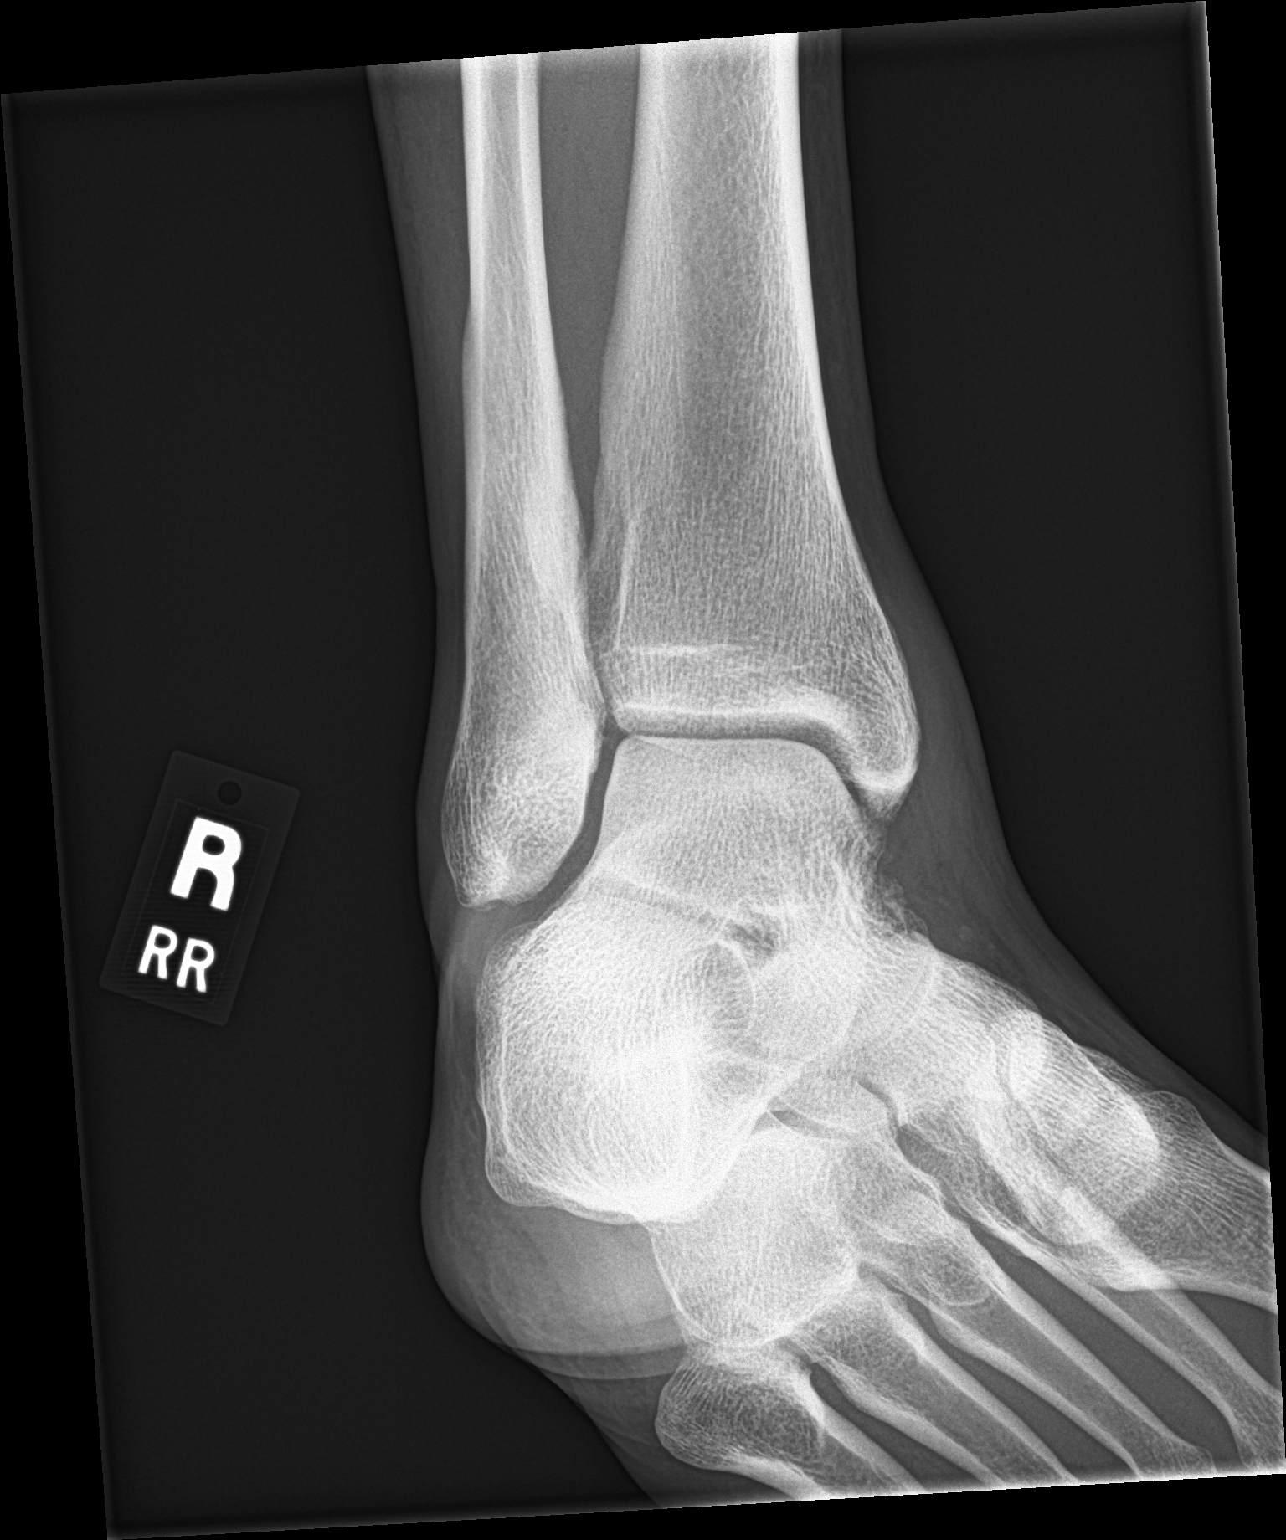

[ankle lat]
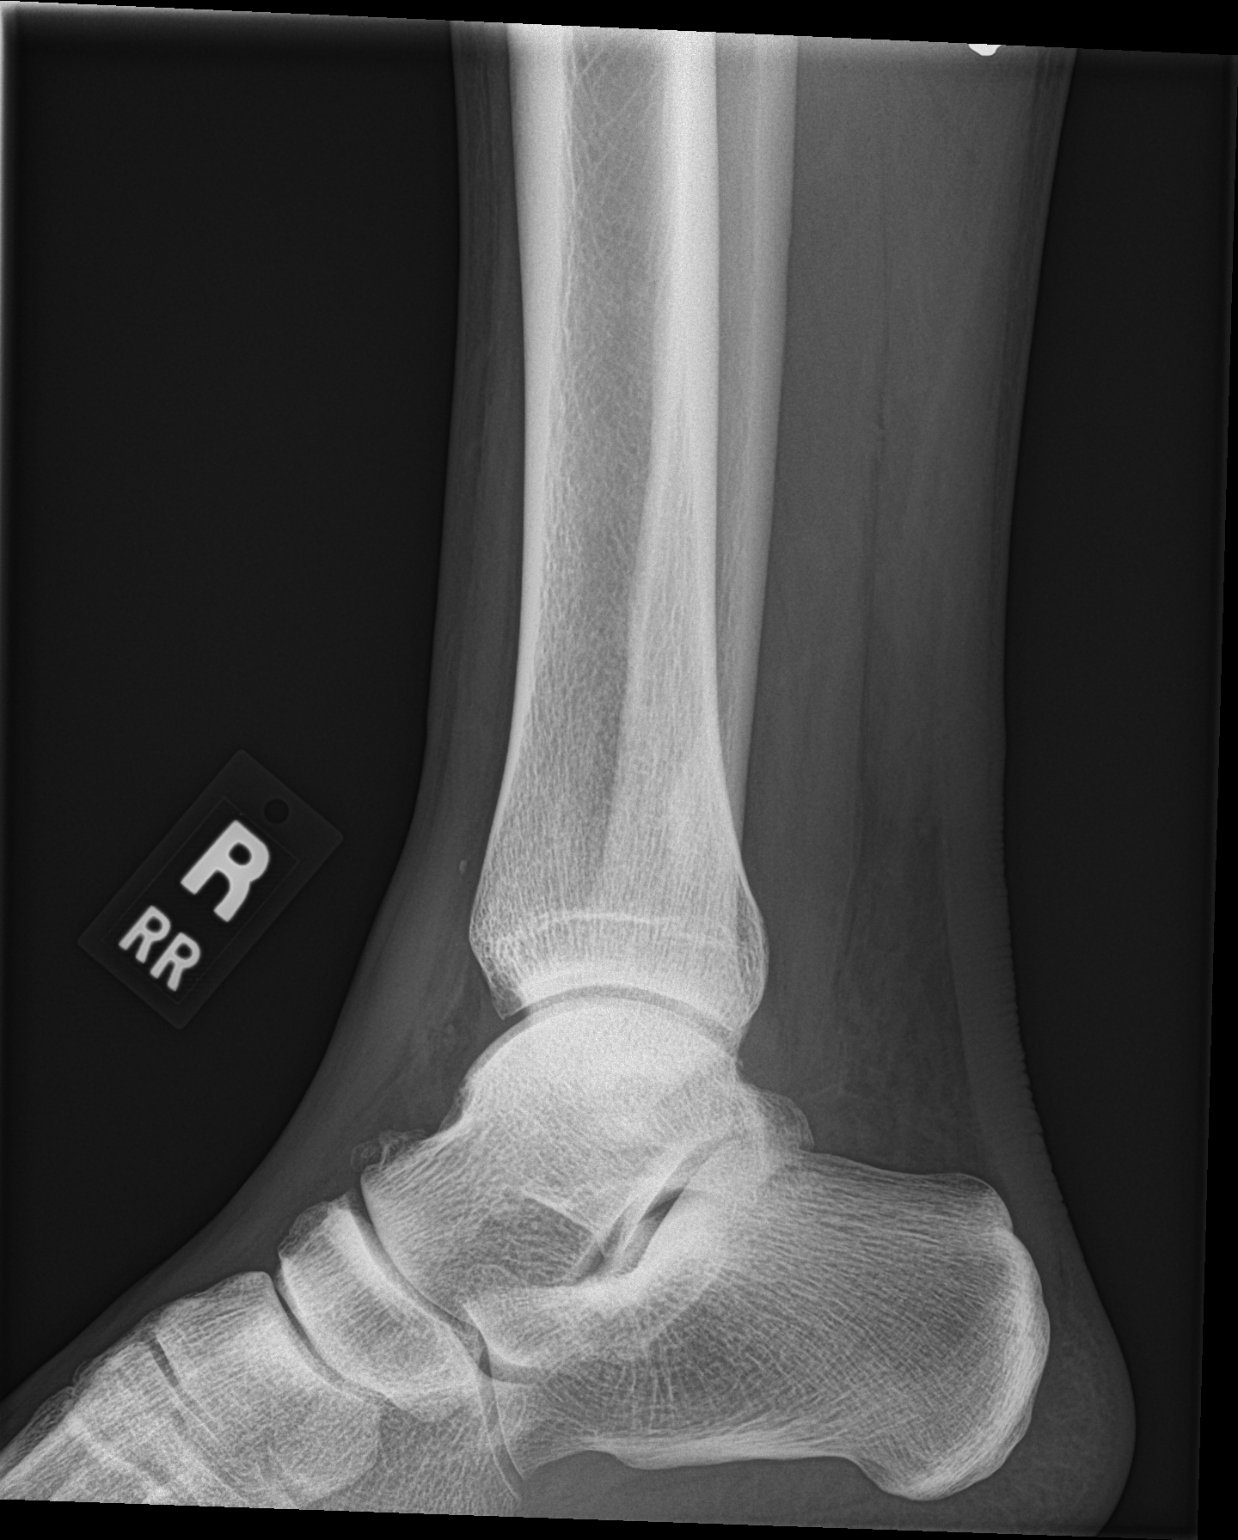

[3 of 3 positions shown; findings below may reference images not displayed]

FINDINGS: No fracture or dislocation. Joint spaces are preserved. Ankle
mortise is preserved. No ankle joint effusion. Presumed dermal
calcification overlies the anterior aspect the lower leg. Minimal
enthesopathic change involving the talar beak. Suspected mild
diffuse soft tissue swelling about the ankle.
IMPRESSION: Mild diffuse soft tissue swelling about the ankle without associated
fracture or dislocation.

## 2017-10-24 ENCOUNTER — Encounter (INDEPENDENT_AMBULATORY_CARE_PROVIDER_SITE_OTHER): Payer: Self-pay | Admitting: Orthopaedic Surgery

## 2017-10-24 ENCOUNTER — Ambulatory Visit (INDEPENDENT_AMBULATORY_CARE_PROVIDER_SITE_OTHER): Payer: Worker's Compensation

## 2017-10-24 ENCOUNTER — Ambulatory Visit (INDEPENDENT_AMBULATORY_CARE_PROVIDER_SITE_OTHER): Payer: Worker's Compensation | Admitting: Orthopaedic Surgery

## 2017-10-24 VITALS — BP 136/80 | HR 72 | Ht 75.0 in

## 2017-10-24 DIAGNOSIS — G8929 Other chronic pain: Secondary | ICD-10-CM | POA: Diagnosis not present

## 2017-10-24 DIAGNOSIS — M25562 Pain in left knee: Secondary | ICD-10-CM

## 2017-10-24 DIAGNOSIS — M7650 Patellar tendinitis, unspecified knee: Secondary | ICD-10-CM

## 2017-10-24 DIAGNOSIS — M67869 Other specified disorders of synovium and tendon, unspecified knee: Secondary | ICD-10-CM

## 2017-10-24 NOTE — Progress Notes (Signed)
Office Visit Note/IME    Patient: Robert Schroeder           Date of Birth: [birth year]           MRN: [id number] Visit Date: 10/24/2017              Requested by: No referring provider defined for this encounter. PCP: Patient, No Pcp Per   Assessment & Plan: Visit Diagnoses:  1. Chronic pain of left knee   2. 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  Appropriate surgery was performed after failed conservative treatment.  I would recommend having his treating surgeon apply an impairment rating for him after his fat pad synovectomy and inferior pole colectomy and patellar tendon tendinosis debridement.  This usually is in  10% range of the leg by [employer] act guidelines.  No additional diagnostic studies or treatment is recommended at this time.  Patient can use some topical Aspercreme or Voltaren gel.  Avoid impact activities such as jumping running.  Exercises such as an exercise bike or elliptical should be tolerated well.  He should avoid stairmaster exercises.  No restrictions on work activities.  My opinion patient is reached MMI and his treating surgeon can assign him impairment rating and release him.  Follow-Up Instructions: Return if symptoms worsen or fail to improve.   Orders:  Orders Placed This Encounter  Procedures  . XR KNEE 3 VIEW LEFT   No orders of the defined types were placed in this encounter.     Procedures: No procedures performed   Clinical Data: No additional findings.   Subjective: Chief Complaint  Patient presents with  . Left Knee - Pain    HPI 32 year old male here for an IME concerning on-the-job injury occurred while working for [employer] and [employer].  Date of injury was 03/21/2013.  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  He was treated conservatively and ultimately underwent knee arthroscopy on 09/18/2014 with left knee arthroscopy and fat pad synovectomy with open patellar tendon debridement and patellar cheilectomy.  Patient played basketball and football his senior year in high school.  He relates a growth spurt his freshman year and also his senior year.  X-rays were reviewed on PACS from 10/1704 showed some calcification at the patellar tendon insertion.  X-rays on 03/21/2013 post on-the-job injury showed more calcification at the inferior pole patella involving the patellar tendon consistent with patellar tendinopathy ( Sinding-Larsen-Johansson syndrome).  Included with the patient is about 50 pages of notes which have been reviewed.  These include MRIs from 04/03/2014 and 11/13/2015.  Findings showed small meniscal cyst posterior to the medial meniscus.  Some increased signal in the distal 3 cm of the quadriceps tendon and abnormalities of the proximal patellar tendon consistent with tendinosis.  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  He complains of knee symptoms with aching in his knee.  Pain is anterior over the proximal area of the patellar tendon.  Postop notes are reviewed and does not appear impairment rating was performed.  He followed up with Dr. Penni Bombard after his surgery by Dr. Thomasena Edis.  Therapy exercises, anti-inflammatories ,nitroglycerin were used.  He denies problems with his opposite knee.   Review of Systems review of systems positive for history of anxiety  depression, previous knee surgery documented above.   Objective: Vital Signs: BP 136/80   Pulse 72   Ht 6\' 3"  (1.905 m)   BMI 30.75 kg/m   Physical Exam  Constitutional: He is oriented to person, place, and time. He appears well-developed and well-nourished.  HENT:  Head: Normocephalic and atraumatic.  Eyes: Pupils are equal, round, and reactive to light. EOM are normal.  Neck: No tracheal deviation present.  No thyromegaly present.  Cardiovascular: Normal rate.  Pulmonary/Chest: Effort normal. He has no wheezes.  Abdominal: Soft. Bowel sounds are normal.  Neurological: He is alert and oriented to person, place, and time.  Skin: Skin is warm and dry. Capillary refill takes less than 2 seconds.  Psychiatric: He has a normal mood and affect. His behavior is normal. Judgment and thought content normal.    Ortho Exam patient has full range of motion no tenderness posteriorly in the popliteal region without noticeable Baker's cyst.  No medial joint line tenderness ACL PCL exam is normal.  Well-healed left knee incision over the patellar tendon with slight tenderness the proximal pole of the patella.  Good flexion full extension.  Distal pulses are intact negative logroll to the hips.  Opposite knee shows no effusion full extension flexion and normal ligamentous exam.  Specialty Comments:  No specialty comments available.  Imaging: Xr Knee 3 View Left  Result Date: 10/24/2017 AP lateral sunrise patellar x-rays are obtained and reviewed.  This shows negative acute changes.  No arthritic changes.  Post cheilectomy changes comparison to 2015 x-rays with 1 x 2 mm area of calcification in the proximal patellar tendon just inferior to the patella. Impression: Left knee negative for acute changes.  Previous cheilectomy inferior pole of patella and calcific tendinosis debridement from 03/21/2013 x-rays.    PMFS History: Patient Active Problem List   Diagnosis Date Noted  . Postop check 03/19/2013  . Sebaceous cyst 02/18/2013  . Chest pain   . Headache(784.0)   . Syncope 10/06/2011   Past Medical History:  Diagnosis Date  . Dental crowns present    x 2  . Family history of anesthesia complication    states father has high tolerance to anesthesia; takes a lot to anesthetize  . Neck mass 02/2013   right    Family History  Problem Relation Age of Onset  . Thyroid disease Mother   . Hypertension  Mother   . Cancer Father        neyamiya psycoma  . Hyperlipidemia Father   . Anesthesia problems Father        states has a high tolerance to anesthesia  . Hypertension Maternal Grandmother   . Alcohol abuse Maternal Grandfather   . Heart disease Paternal Grandmother   . Heart disease Paternal Grandfather     Past Surgical History:  Procedure Laterality Date  . CYST REMOVAL NECK Right 02/21/2013   Procedure: CYST REMOVAL POSTERIOR NECK;  Surgeon: Cherylynn Ridges, MD;  Location: [facility];  Service: General;  Laterality: Right;  Posterior neck.  Marland Kitchen KNEE SURGERY  2016  . NO PAST SURGERIES     Social History   Occupational History  . Not on file  Tobacco Use  . Smoking status: Never Smoker  . Smokeless tobacco: Never Used  Substance and Sexual Activity  . Alcohol use: Yes    Comment: occasionally  . Drug use: No  . Sexual activity: Not on file

## 2017-10-25 ENCOUNTER — Telehealth (INDEPENDENT_AMBULATORY_CARE_PROVIDER_SITE_OTHER): Payer: Self-pay

## 2017-10-25 NOTE — Telephone Encounter (Signed)
-----   Message from Eldred Manges, MD sent at 10/24/2017  9:19 AM EDT ----- IME please send thanks

## 2017-10-25 NOTE — Telephone Encounter (Signed)
Emailed office note to [employer] @ [facility] ([email or web address])

## 2018-06-06 ENCOUNTER — Ambulatory Visit: Payer: Self-pay | Admitting: Orthopaedic Surgery

## 2018-06-26 ENCOUNTER — Telehealth: Payer: Self-pay

## 2018-06-26 NOTE — Telephone Encounter (Signed)
Emailed case mgr, wc adj, and atty contact to advise and that 06/29/18 appt will be cancelled  [email or web address]  I also left a vm for Tammy Davis case mgr with [place] [employer] [phone]

## 2018-06-26 NOTE — Telephone Encounter (Signed)
Received vm from Baird Kay (case mgr) stating pt has had no treatment since he was last seen here P)[phone]

## 2018-06-26 NOTE — Telephone Encounter (Signed)
I saw once for IME, I recommended rating and release. Sounds like my recommendations were not followed so no reason for me to see him 8 or 9 months later.  Respectfully decline offer.

## 2018-06-26 NOTE — Telephone Encounter (Signed)
Pt was seen here last Oct for an IME. Appt was made by front desk for a f/u for 6/26. I emailed Engineering geologist with the atty office that set up the IME to see if she knew why we needed to see the pt again and if the appt is authorized by [facility]  email received back:  The adjuster, Cami Myrtie Hawk, with [employer] directed the nurse case manager Maryann Conners with [place] [employer]) to schedule an appointment with Dr. Lorin Mercy for evaluation/treatment of continued left knee symptoms. The appointment has been authorized by the workers' compensation carrier.     Teodora Medici  Paralegal to [facility]  I emailed her back to see if pt has had any treatment since October of last year and if so she needs to send these notes Are you ok with seeing him back again?

## 2018-06-29 ENCOUNTER — Ambulatory Visit: Payer: Self-pay | Admitting: Orthopaedic Surgery
